# Patient Record
Sex: Female | Born: 1985 | Hispanic: No | Marital: Married | Smoking: Never smoker
Health system: Southern US, Community
[De-identification: ages and names within clinical notes are randomized; demographics above are authoritative.]

## PROBLEM LIST (undated history)

## (undated) DIAGNOSIS — J45909 Unspecified asthma, uncomplicated: Secondary | ICD-10-CM

## (undated) DIAGNOSIS — K219 Gastro-esophageal reflux disease without esophagitis: Secondary | ICD-10-CM

## (undated) DIAGNOSIS — J9383 Other pneumothorax: Secondary | ICD-10-CM

## (undated) HISTORY — DX: Gastro-esophageal reflux disease without esophagitis: K21.9

## (undated) HISTORY — DX: Other pneumothorax: J93.83

---

## 2019-07-28 ENCOUNTER — Ambulatory Visit: Payer: Self-pay | Attending: Internal Medicine

## 2019-07-28 DIAGNOSIS — Z23 Encounter for immunization: Secondary | ICD-10-CM | POA: Insufficient documentation

## 2019-07-28 NOTE — Progress Notes (Signed)
   Covid-19 Vaccination Clinic  Name:  Nourah Caulkins    MRN: [id number] DOB: [birth year]  07/28/2019  Ms. Bubba Camp was observed post Covid-19 immunization for 15 minutes without incidence. She was provided with Vaccine Information Sheet and instruction to access the V-Safe system.   Ms. Bubba Camp was instructed to call 911 with any severe reactions post vaccine: Marland Kitchen Difficulty breathing  . Swelling of your face and throat  . A fast heartbeat  . A bad rash all over your body  . Dizziness and weakness    Immunizations Administered    Name Date Dose VIS Date Route   Pfizer COVID-19 Vaccine 07/28/2019  8:31 AM 0.3 mL 05/18/2019 Intramuscular   Manufacturer: [place]   Lot: [id number]   [place]: [id number]

## 2019-08-22 ENCOUNTER — Ambulatory Visit: Payer: Self-pay | Attending: Internal Medicine

## 2019-08-22 DIAGNOSIS — Z23 Encounter for immunization: Secondary | ICD-10-CM

## 2019-08-22 NOTE — Progress Notes (Signed)
   Covid-19 Vaccination Clinic  Name:  Falana Lane    MRN: [id number] DOB: 30-May-1986  08/22/2019  Rachel Molina was observed post Covid-19 immunization for 15 minutes without incident. She was provided with Vaccine Information Sheet and instruction to access the V-Safe system.   Rachel Molina was instructed to call 911 with any severe reactions post vaccine: Marland Kitchen Difficulty breathing  . Swelling of face and throat  . A fast heartbeat  . A bad rash all over body  . Dizziness and weakness   Immunizations Administered    Name Date Dose VIS Date Route   Pfizer COVID-19 Vaccine 08/22/2019  4:45 PM 0.3 mL 05/18/2019 Intramuscular   Manufacturer: [place]   Lot: [id number]   [place]: [id number]

## 2019-12-15 DIAGNOSIS — Z833 Family history of diabetes mellitus: Secondary | ICD-10-CM | POA: Diagnosis not present

## 2019-12-15 DIAGNOSIS — R0600 Dyspnea, unspecified: Secondary | ICD-10-CM | POA: Diagnosis not present

## 2019-12-15 DIAGNOSIS — J9811 Atelectasis: Secondary | ICD-10-CM | POA: Diagnosis not present

## 2019-12-15 DIAGNOSIS — J939 Pneumothorax, unspecified: Secondary | ICD-10-CM | POA: Diagnosis not present

## 2019-12-15 DIAGNOSIS — J9383 Other pneumothorax: Secondary | ICD-10-CM | POA: Diagnosis not present

## 2019-12-15 DIAGNOSIS — Z8249 Family history of ischemic heart disease and other diseases of the circulatory system: Secondary | ICD-10-CM | POA: Diagnosis not present

## 2019-12-15 DIAGNOSIS — J9311 Primary spontaneous pneumothorax: Secondary | ICD-10-CM | POA: Diagnosis not present

## 2019-12-15 DIAGNOSIS — J45909 Unspecified asthma, uncomplicated: Secondary | ICD-10-CM | POA: Diagnosis not present

## 2019-12-15 DIAGNOSIS — J9382 Other air leak: Secondary | ICD-10-CM | POA: Diagnosis not present

## 2019-12-15 DIAGNOSIS — J930 Spontaneous tension pneumothorax: Secondary | ICD-10-CM | POA: Diagnosis not present

## 2019-12-16 DIAGNOSIS — D72829 Elevated white blood cell count, unspecified: Secondary | ICD-10-CM | POA: Diagnosis not present

## 2019-12-16 DIAGNOSIS — J9383 Other pneumothorax: Secondary | ICD-10-CM | POA: Diagnosis not present

## 2019-12-16 DIAGNOSIS — J45909 Unspecified asthma, uncomplicated: Secondary | ICD-10-CM | POA: Diagnosis not present

## 2019-12-16 DIAGNOSIS — J939 Pneumothorax, unspecified: Secondary | ICD-10-CM | POA: Diagnosis not present

## 2019-12-16 DIAGNOSIS — R0600 Dyspnea, unspecified: Secondary | ICD-10-CM | POA: Diagnosis not present

## 2019-12-16 DIAGNOSIS — J982 Interstitial emphysema: Secondary | ICD-10-CM | POA: Diagnosis not present

## 2019-12-17 DIAGNOSIS — J9383 Other pneumothorax: Secondary | ICD-10-CM | POA: Diagnosis not present

## 2019-12-17 DIAGNOSIS — J45909 Unspecified asthma, uncomplicated: Secondary | ICD-10-CM | POA: Diagnosis not present

## 2019-12-17 DIAGNOSIS — R0600 Dyspnea, unspecified: Secondary | ICD-10-CM | POA: Diagnosis not present

## 2019-12-17 DIAGNOSIS — J939 Pneumothorax, unspecified: Secondary | ICD-10-CM | POA: Diagnosis not present

## 2019-12-17 DIAGNOSIS — D72829 Elevated white blood cell count, unspecified: Secondary | ICD-10-CM | POA: Diagnosis not present

## 2019-12-18 DIAGNOSIS — D72829 Elevated white blood cell count, unspecified: Secondary | ICD-10-CM | POA: Diagnosis not present

## 2019-12-18 DIAGNOSIS — J9383 Other pneumothorax: Secondary | ICD-10-CM | POA: Diagnosis not present

## 2019-12-18 DIAGNOSIS — J90 Pleural effusion, not elsewhere classified: Secondary | ICD-10-CM | POA: Diagnosis not present

## 2019-12-18 DIAGNOSIS — J45909 Unspecified asthma, uncomplicated: Secondary | ICD-10-CM | POA: Diagnosis not present

## 2019-12-18 DIAGNOSIS — J939 Pneumothorax, unspecified: Secondary | ICD-10-CM | POA: Diagnosis not present

## 2019-12-18 DIAGNOSIS — R0600 Dyspnea, unspecified: Secondary | ICD-10-CM | POA: Diagnosis not present

## 2019-12-18 DIAGNOSIS — J9811 Atelectasis: Secondary | ICD-10-CM | POA: Diagnosis not present

## 2019-12-19 DIAGNOSIS — R079 Chest pain, unspecified: Secondary | ICD-10-CM | POA: Diagnosis not present

## 2019-12-19 DIAGNOSIS — Z8709 Personal history of other diseases of the respiratory system: Secondary | ICD-10-CM | POA: Diagnosis not present

## 2019-12-19 DIAGNOSIS — R0789 Other chest pain: Secondary | ICD-10-CM | POA: Diagnosis not present

## 2019-12-20 ENCOUNTER — Emergency Department (HOSPITAL_COMMUNITY): Payer: BC Managed Care – PPO

## 2019-12-20 ENCOUNTER — Telehealth: Payer: Self-pay | Admitting: Pulmonary Disease

## 2019-12-20 ENCOUNTER — Emergency Department (HOSPITAL_COMMUNITY)
Admission: EM | Admit: 2019-12-20 | Discharge: 2019-12-20 | Disposition: A | Discharge status: Home / Self Care | Payer: BC Managed Care – PPO | Attending: Emergency Medicine | Admitting: Emergency Medicine

## 2019-12-20 ENCOUNTER — Other Ambulatory Visit: Payer: Self-pay

## 2019-12-20 ENCOUNTER — Encounter (HOSPITAL_COMMUNITY): Payer: Self-pay

## 2019-12-20 DIAGNOSIS — R0789 Other chest pain: Secondary | ICD-10-CM | POA: Diagnosis not present

## 2019-12-20 DIAGNOSIS — R0602 Shortness of breath: Secondary | ICD-10-CM | POA: Diagnosis not present

## 2019-12-20 DIAGNOSIS — R42 Dizziness and giddiness: Secondary | ICD-10-CM | POA: Diagnosis not present

## 2019-12-20 DIAGNOSIS — R21 Rash and other nonspecific skin eruption: Secondary | ICD-10-CM | POA: Diagnosis not present

## 2019-12-20 DIAGNOSIS — J45909 Unspecified asthma, uncomplicated: Secondary | ICD-10-CM | POA: Insufficient documentation

## 2019-12-20 DIAGNOSIS — Z20822 Contact with and (suspected) exposure to covid-19: Secondary | ICD-10-CM | POA: Diagnosis not present

## 2019-12-20 DIAGNOSIS — J939 Pneumothorax, unspecified: Secondary | ICD-10-CM | POA: Diagnosis not present

## 2019-12-20 DIAGNOSIS — R000 Tachycardia, unspecified: Secondary | ICD-10-CM | POA: Insufficient documentation

## 2019-12-20 DIAGNOSIS — R5383 Other fatigue: Secondary | ICD-10-CM | POA: Diagnosis not present

## 2019-12-20 DIAGNOSIS — J90 Pleural effusion, not elsewhere classified: Secondary | ICD-10-CM | POA: Diagnosis not present

## 2019-12-20 DIAGNOSIS — I10 Essential (primary) hypertension: Secondary | ICD-10-CM | POA: Diagnosis not present

## 2019-12-20 DIAGNOSIS — R52 Pain, unspecified: Secondary | ICD-10-CM | POA: Diagnosis not present

## 2019-12-20 HISTORY — DX: Unspecified asthma, uncomplicated: J45.909

## 2019-12-20 LAB — CBC WITH DIFFERENTIAL/PLATELET
Abs Immature Granulocytes: 0.03 10*3/uL (ref 0.00–0.07)
Basophils Absolute: 0 10*3/uL (ref 0.0–0.1)
Basophils Relative: 1 %
Eosinophils Absolute: 0.2 10*3/uL (ref 0.0–0.5)
Eosinophils Relative: 2 %
HCT: 39.2 % (ref 36.0–46.0)
Hemoglobin: 12.3 g/dL (ref 12.0–15.0)
Immature Granulocytes: 0 %
Lymphocytes Relative: 15 %
Lymphs Abs: 1.3 10*3/uL (ref 0.7–4.0)
MCH: 28.5 pg (ref 26.0–34.0)
MCHC: 31.4 g/dL (ref 30.0–36.0)
MCV: 90.7 fL (ref 80.0–100.0)
Monocytes Absolute: 0.8 10*3/uL (ref 0.1–1.0)
Monocytes Relative: 9 %
Neutro Abs: 6.3 10*3/uL (ref 1.7–7.7)
Neutrophils Relative %: 73 %
Platelets: 299 10*3/uL (ref 150–400)
RBC: 4.32 MIL/uL (ref 3.87–5.11)
RDW: 11.8 % (ref 11.5–15.5)
WBC: 8.6 10*3/uL (ref 4.0–10.5)
nRBC: 0 % (ref 0.0–0.2)

## 2019-12-20 LAB — URINALYSIS, ROUTINE W REFLEX MICROSCOPIC
Bilirubin Urine: NEGATIVE
Glucose, UA: NEGATIVE mg/dL
Hgb urine dipstick: NEGATIVE
Ketones, ur: NEGATIVE mg/dL
Leukocytes,Ua: NEGATIVE
Nitrite: NEGATIVE
Protein, ur: NEGATIVE mg/dL
Specific Gravity, Urine: 1.003 — ABNORMAL LOW (ref 1.005–1.030)
pH: 7 (ref 5.0–8.0)

## 2019-12-20 LAB — BASIC METABOLIC PANEL
Anion gap: 10 (ref 5–15)
BUN: 10 mg/dL (ref 6–20)
CO2: 23 mmol/L (ref 22–32)
Calcium: 9.4 mg/dL (ref 8.9–10.3)
Chloride: 105 mmol/L (ref 98–111)
Creatinine, Ser: 0.65 mg/dL (ref 0.44–1.00)
GFR calc Af Amer: >60 mL/min (ref >60)
GFR calc non Af Amer: >60 mL/min (ref >60)
Glucose, Bld: 122 mg/dL — ABNORMAL HIGH (ref 70–99)
Potassium: 4.6 mmol/L (ref 3.5–5.1)
Sodium: 138 mmol/L (ref 135–145)

## 2019-12-20 LAB — I-STAT BETA HCG BLOOD, ED (MC, WL, AP ONLY): I-stat hCG, quantitative: <5 m[IU]/mL (ref <5)

## 2019-12-20 LAB — LACTIC ACID, PLASMA: Lactic Acid, Venous: 1.9 mmol/L (ref 0.5–1.9)

## 2019-12-20 LAB — SARS CORONAVIRUS 2 BY RT PCR (HOSPITAL ORDER, PERFORMED IN ~~LOC~~ HOSPITAL LAB): SARS Coronavirus 2: NEGATIVE

## 2019-12-20 MED ORDER — SODIUM CHLORIDE 0.9 % IV BOLUS
500.0000 mL | Freq: Once | INTRAVENOUS | Status: AC
  Administered 2019-12-20: 500 mL via INTRAVENOUS

## 2019-12-20 MED ORDER — IOHEXOL 350 MG/ML SOLN
75.0000 mL | Freq: Once | INTRAVENOUS | Status: AC | PRN
  Administered 2019-12-20: 75 mL via INTRAVENOUS

## 2019-12-20 NOTE — ED Provider Notes (Signed)
[name] EMERGENCY DEPARTMENT Provider Note   CSN: [id number] Arrival date & time: 12/20/19  1801     History Chief Complaint  Patient presents with  . Shortness of Breath  . Tachycardia    Rachel Molina is a 34 y.o. female presents to the ED for evaluation of noted tachycardia by PCP earlier today in the office.  Associated with lightheadedness and generalized weakness, shortness of breath with exertion onset today.  PCP called 911 and recommended ED evaluation due to concern for PE.  Patient had recent right spontaneous pneumothorax while traveling to [place].  She was admitted for 3 days in [facility].  Discharged on 7/13.  Has been generally weak since with some right sided chest wall pain where the chest tube was.  They were unable to fly back from [place], so they drove back to the area.  The longest amount of time she was in the car was for 5 to 6 hours before taking breaks.  Has been taken ibuprofen as needed for pain.  Reports ongoing mild right-sided axillary pain on the chest wall where the chest tube has been that is worse with palpation, movement and deep breathing.  No significant changes in pain in this area however.  Denies fever, cough.  Denies recent vomiting or diarrhea.  No UTI symptoms, abdominal or flank pain.  Fully vaccinated for Covid.  Does not take any birth controls.  No history of PE.  Denies significant leg swelling or calf pain, hemoptysis.  HPI     Past Medical History:  Diagnosis Date  . Asthma     There are no problems to display for this patient.   History reviewed. No pertinent surgical history.   OB History   No obstetric history on file.     History reviewed. No pertinent family history.  Social History   Tobacco Use  . Smoking status: Never Smoker  . Smokeless tobacco: Never Used  Substance Use Topics  . Alcohol use: Not Currently  . Drug use: Not Currently    Home Medications Prior to Admission medications    Medication Sig Start Date End Date Taking? Authorizing Provider  ibuprofen (ADVIL) 200 MG tablet Take 600 mg by mouth every 6 (six) hours as needed for moderate pain.   Yes [provider]  PROAIR HFA 108 (90 Base) MCG/ACT inhaler Inhale 2 puffs into the lungs every 6 (six) hours as needed for wheezing or shortness of breath.  12/13/19  Yes [provider]    Allergies    Patient has no known allergies.  Review of Systems   Review of Systems  Constitutional: Positive for fatigue.  Respiratory: Positive for shortness of breath.   Cardiovascular: Positive for chest pain (right chest wall).  Neurological: Positive for weakness (generalized) and light-headedness.  All other systems reviewed and are negative.   Physical Exam Updated Vital Signs BP 120/83   Pulse 81   Temp 100.2 F (37.9 C) (Oral)   Resp (!) 22   Ht 5\' 4"  (1.626 m)   Wt 60.8 kg   LMP 12/05/2019 (Within Days)   SpO2 98%   BMI 23.00 kg/m   Physical Exam Vitals and nursing note reviewed.  Constitutional:      General: She is not in acute distress.    Appearance: She is well-developed.     Comments: NAD.  HENT:     Head: Normocephalic and atraumatic.     Right Ear: External ear normal.  Left Ear: External ear normal.     Nose: Nose normal.  Eyes:     General: No scleral icterus.    Conjunctiva/sclera: Conjunctivae normal.  Cardiovascular:     Rate and Rhythm: Regular rhythm. Tachycardia present.     Heart sounds: Normal heart sounds. No murmur heard.      Comments: HR 100-120s at rest. Regular. No LE edema. No calf tenderness. No murmurs.  Pulmonary:     Effort: Pulmonary effort is normal.     Breath sounds: Normal breath sounds.  Chest:     Chest wall: Tenderness present.     Comments: Approx 2 cm straight wound right midaxillary line consistent with recent chest tube placement. No surrounding erythema, warmth, fluctuance, discharge.  Musculoskeletal:        General: No deformity.  Normal range of motion.     Cervical back: Normal range of motion and neck supple.  Skin:    General: Skin is warm and dry.     Capillary Refill: Capillary refill takes less than 2 seconds.  Neurological:     Mental Status: She is alert and oriented to person, place, and time.  Psychiatric:        Behavior: Behavior normal.        Thought Content: Thought content normal.        Judgment: Judgment normal.     ED Results / Procedures / Treatments   Labs (all labs ordered are listed, but only abnormal results are displayed) Labs Reviewed  BASIC METABOLIC PANEL - Abnormal; Notable for the following components:      Result Value   Glucose, Bld 122 (*)    All other components within normal limits  URINALYSIS, ROUTINE W REFLEX MICROSCOPIC - Abnormal; Notable for the following components:   Color, Urine COLORLESS (*)    Specific Gravity, Urine 1.003 (*)    All other components within normal limits  SARS CORONAVIRUS 2 BY RT PCR (HOSPITAL ORDER, [facility])  CBC WITH DIFFERENTIAL/PLATELET  LACTIC ACID, PLASMA  LACTIC ACID, PLASMA  I-STAT BETA HCG BLOOD, ED ([facility], [facility], [facility] ONLY)    EKG EKG Interpretation  Date/Time:  Thursday December 20 2019 18:52:43 EDT Ventricular Rate:  91 PR Interval:    QRS Duration: 79 QT Interval:  345 QTC Calculation: 425 R Axis:   71 Text Interpretation: Sinus rhythm unremarkable ECG Confirmed by Carmin Muskrat [phone]) on 12/20/2019 6:57:36 PM   Radiology DG Chest 2 View  Result Date: 12/20/2019 CLINICAL DATA:  34 year old female with recent spontaneous pneumothorax on the right side. EXAM: CHEST - 2 VIEW COMPARISON:  None FINDINGS: The lungs are clear. There is no pleural effusion. There is minimal pleural reflection of the lateral right upper lobe. No detectable pneumothorax. The cardiac silhouette is within limits. No acute osseous pathology. IMPRESSION: No active cardiopulmonary disease. Electronically Signed   By: Anner Crete M.D.   On: 12/20/2019 19:38   CT Angio Chest PE W and/or Wo Contrast  Result Date: 12/20/2019 CLINICAL DATA:  Shortness of breath. Concern for PE. Recent right-sided tension pneumothorax. EXAM: CT ANGIOGRAPHY CHEST WITH CONTRAST TECHNIQUE: Multidetector CT imaging of the chest was performed using the standard protocol during bolus administration of intravenous contrast. Multiplanar CT image reconstructions and MIPs were obtained to evaluate the vascular anatomy. CONTRAST:  49mL OMNIPAQUE IOHEXOL 350 MG/ML SOLN COMPARISON:  None. FINDINGS: Cardiovascular: Contrast injection is sufficient to demonstrate satisfactory opacification of the pulmonary arteries to the segmental level. There  is no pulmonary embolus or evidence of right heart strain. The size of the main pulmonary artery is normal. Heart size is normal, with no pericardial effusion. The course and caliber of the aorta are normal. There is no atherosclerotic calcification. Opacification decreased due to pulmonary arterial phase contrast bolus timing. Mediastinum/Nodes: -- No mediastinal lymphadenopathy. -- No hilar lymphadenopathy. -- No axillary lymphadenopathy. -- No supraclavicular lymphadenopathy. -- Normal thyroid gland where visualized. -  Unremarkable esophagus. Lungs/Pleura: There is a trace right-sided pleural effusion with adjacent atelectasis. There is no pneumothorax. There are a few pockets of gas in the patient's right chest wall, presumably related to a recent right-sided chest tube placement. The left lung field is clear. Upper Abdomen: Contrast bolus timing is not optimized for evaluation of the abdominal organs. The visualized portions of the organs of the upper abdomen are normal. Musculoskeletal: No chest wall abnormality. No bony spinal canal stenosis. Review of the MIP images confirms the above findings. IMPRESSION: 1. No acute pulmonary embolism. 2. Trace right-sided pleural effusion with adjacent atelectasis. 3. A few  pockets of gas in the patient's right chest wall, presumably related to a recent right-sided chest tube placement. No pneumothorax. Electronically Signed   By: Constance Holster M.D.   On: 12/20/2019 20:08    Procedures Procedures (including critical care time)  Medications Ordered in ED Medications  sodium chloride 0.9 % bolus 500 mL (0 mLs Intravenous Stopped 12/20/19 2000)  iohexol (OMNIPAQUE) 350 MG/ML injection 75 mL (75 mLs Intravenous Contrast Given 12/20/19 2000)    ED Course  I have reviewed the triage vital signs and the nursing notes.  Pertinent labs & imaging results that were available during my care of the patient were reviewed by me and considered in my medical decision making (see chart for details).  Clinical Course as of Dec 20 2246  Thu Dec 20, 2019  1826 Resp(!): 23 [CG]  1826 Pulse Rate(!): 110 [CG]  1826 Temp: 100.2 F (37.9 C) [CG]  1942 The lungs are clear. There is no pleural effusion. There is minimal pleural reflection of the lateral right upper lobe. No detectable pneumothorax. The cardiac silhouette is within limits. No acute osseous pathology.    DG Chest 2 View [CG]    Clinical Course User Index [CG] Kinnie Feil, PA-C   MDM Rules/Calculators/A&P                          I obtained additional history from triage, nursing notes and review of medical chart.  Previous medical records available, nursing notes reviewed to obtain more history and assist with MDM.  No records available from outside hospital/hospitalization.  Patient complains of generalized weakness, lightheadedness and increased fatigue.  Seems her right-sided chest wall pain from chest tube is unchanged.  No fever.  No cough.  No other infectious symptomatology.  Fully vaccinated for COVID-19.  The differential diagnosis includes infectious process like CAP, COVID-19,?  Covid variant.  High risk for PE given recent hospitalization, long car ride.  Overall nontoxic-appearing,  however is tachycardic, tachypneic.  Oral temp is 100.2.  Hemodynamically stable without hypoxia.  I have added lab work including screening labs, lactic acid, COVID-19 swab.  I have ordered 500 cc IV fluid to address tachycardia.  Ordered chest x-ray, CTA to rule out PE.  ER work-up personally visualized and interpreted, this is vastly reassuring and normal.  No evidence of PE on CTA.  Covid test is negative.  Normal hemoglobin.  UA without signs of infection.  Patient reevaluated after IV fluids and feels a little bit better.  Tachycardia has completely improved.  Discussed ER work-up in detail with patient's husband who is a rheumatologist.  No source identified today of patient's symptoms.  Discussed with EDP, appropriate for discharge with close PCP follow-up.  Patient and husband in agreement with plan.   Final Clinical Impression(s) / ED Diagnoses Final diagnoses:  Sinus tachycardia    Rx / DC Orders ED Discharge Orders    None       Arlean Hopping 12/20/19 2248    Carmin Muskrat, MD 12/22/19 [phone]

## 2019-12-20 NOTE — Telephone Encounter (Signed)
Dr Vaughan Browner received request from another provider for pulmonary consult. Dr Vaughan Browner can see Patient 12/25/19, at 3:15pm. ATC Patient, but had to leave message for her to call back.

## 2019-12-20 NOTE — ED Triage Notes (Signed)
Pt BIB [facility] from PCP for eval of tachycardia. Pt recently had tension PNX 5 days ago, had chest tube that was removed 3 days ago. At PCP today for follow up and was found to have dyspnea on exertion, dizziness and tachycardia after climbing a flight of stairs. Per EMS, pt was hospitalized in [place] for tension PNX.

## 2019-12-20 NOTE — Discharge Instructions (Addendum)
Normal labs, chest x-ray, CTA  Tachycardia improved after IVF  Symptoms of unclear etiology  Rest, stay hydrated  Follow up closely with primary care doctor to ensure symptoms improve   Return for worsening symptoms

## 2019-12-21 NOTE — Telephone Encounter (Signed)
Called and spoke with Patient.  Patient scheduled 12/25/19 at 1515, with Dr Vaughan Browner. Nothing further at this time.

## 2019-12-25 ENCOUNTER — Encounter: Payer: Self-pay | Admitting: Pulmonary Disease

## 2019-12-25 ENCOUNTER — Ambulatory Visit (INDEPENDENT_AMBULATORY_CARE_PROVIDER_SITE_OTHER): Payer: BC Managed Care – PPO | Admitting: Pulmonary Disease

## 2019-12-25 ENCOUNTER — Other Ambulatory Visit: Payer: Self-pay

## 2019-12-25 VITALS — BP 118/86 | HR 105 | Temp 98.5°F | Ht 64.0 in | Wt 135.6 lb

## 2019-12-25 DIAGNOSIS — J849 Interstitial pulmonary disease, unspecified: Secondary | ICD-10-CM

## 2019-12-25 DIAGNOSIS — J9311 Primary spontaneous pneumothorax: Secondary | ICD-10-CM | POA: Diagnosis not present

## 2019-12-25 NOTE — Progress Notes (Signed)
Rachel Molina    [id number]    [birth year]  Primary Care Physician:Patient, No Pcp Per  Referring Physician: No referring provider defined for this encounter.  Chief complaint: Consult for pneumothorax  HPI: 34 year old with history of allergies, mild intermittent asthma Recently hospitalized at [place] for spontaneous pneumothorax.  She was visiting [place] with her husband who is a rheumatologist and was attending a medical conference.  Presentation preceded by 3 days of increasing chest pain, dizziness, dyspnea.   I reviewed the notes from [place].  She was treated with chest tube placement.  Pulmonary consulted who recommended CT but they wanted to get that back and [place].  Travel back to [facility] by car she is reevaluated by her primary care and noted to be tachycardic and sent to ED here.  CTA was negative for pulmonary embolism or any acute lung abnormality.  She did not require repeat hospitalization  Chief complaint today is some soreness, fatigue.  Denies any dyspnea.  Pets: No pets Occupation: Therapist, sports Exposures: No known exposures.  No mold, hot tub, Jacuzzi Smoking history: Never smoker Travel history: Originally from [place].  Immigrated to [place] in 2007.  Previously lived in the [place] area Relevant family history: Father had asthma. She does not have any family history of spontaneous pneumothorax.  Outpatient Encounter Medications as of 12/25/2019  Medication Sig  . PROAIR HFA 108 (90 Base) MCG/ACT inhaler Inhale 2 puffs into the lungs every 6 (six) hours as needed for wheezing or shortness of breath.   Marland Kitchen ibuprofen (ADVIL) 200 MG tablet Take 600 mg by mouth every 6 (six) hours as needed for moderate pain. (Patient not taking: Reported on 12/25/2019)   No facility-administered encounter medications on file as of 12/25/2019.    Allergies as of 12/25/2019  . (No Known Allergies)    Past Medical History:  Diagnosis Date  . Asthma     History  reviewed. No pertinent surgical history.  History reviewed. No pertinent family history.  Social History   Socioeconomic History  . Marital status: Married    Spouse name: Not on file  . Number of children: Not on file  . Years of education: Not on file  . Highest education level: Not on file  Occupational History  . Not on file  Tobacco Use  . Smoking status: Never Smoker  . Smokeless tobacco: Never Used  Substance and Sexual Activity  . Alcohol use: Not Currently  . Drug use: Not Currently  . Sexual activity: Not on file  Other Topics Concern  . Not on file  Social History Narrative  . Not on file   Social Determinants of Health   Financial Resource Strain:   . Difficulty of Paying Living Expenses:   Food Insecurity:   . Worried About Charity fundraiser in the Last Year:   . Arboriculturist in the Last Year:   Transportation Needs:   . Film/video editor (Medical):   Marland Kitchen Lack of Transportation (Non-Medical):   Physical Activity:   . Days of Exercise per Week:   . Minutes of Exercise per Session:   Stress:   . Feeling of Stress :   Social Connections:   . Frequency of Communication with Friends and Family:   . Frequency of Social Gatherings with Friends and Family:   . Attends Religious Services:   . Active Member of Clubs or Organizations:   . Attends Archivist Meetings:   .  Marital Status:   Intimate Partner Violence:   . Fear of Current or Ex-Partner:   . Emotionally Abused:   Marland Kitchen Physically Abused:   . Sexually Abused:     Review of systems: Review of Systems  Constitutional: Negative for fever and chills.  HENT: Negative.   Eyes: Negative for blurred vision.  Respiratory: as per HPI  Cardiovascular: Negative for chest pain and palpitations.  Gastrointestinal: Negative for vomiting, diarrhea, blood per rectum. Genitourinary: Negative for dysuria, urgency, frequency and hematuria.  Musculoskeletal: Negative for myalgias, back pain and  joint pain.  Skin: Negative for itching and rash.  Neurological: Negative for dizziness, tremors, focal weakness, seizures and loss of consciousness.  Endo/Heme/Allergies: Negative for environmental allergies.  Psychiatric/Behavioral: Negative for depression, suicidal ideas and hallucinations.  All other systems reviewed and are negative.  Physical Exam: Blood pressure 118/86, pulse (!) 105, temperature 98.5 F (36.9 C), temperature source Oral, height 5\' 4"  (1.626 m), weight 135 lb 9.6 oz (61.5 kg), last menstrual period 12/05/2019, SpO2 98 %. Gen:      No acute distress HEENT:  EOMI, sclera anicteric Neck:     No masses; no thyromegaly Lungs:    Clear to auscultation bilaterally; normal respiratory effort CV:         Regular rate and rhythm; no murmurs Abd:      + bowel sounds; soft, non-tender; no palpable masses, no distension Ext:    No edema; adequate peripheral perfusion Skin:      Warm and dry; no rash Neuro: alert and oriented x 3 Psych: normal mood and affect  Data Reviewed: Imaging: CTA 12/20/2019-trace right effusion with atelectasis.  Otherwise lungs are clear.  Pocket of gas in the right chest wall likely related to recent chest tube placement I have reviewed the images personally.  PFTs:  Labs: CBC 12/20/2019-WBC 8.6, eos 2%, absolute eosinophil count 172  Assessment:  Spontaneous pneumothorax She has history of mild intermittent asthma not on controller medication No family history or symptoms to suggest any underlying interstitial lung disease  We will get high-resolution CT and PFTs for better evaluation of her lungs Advised her to start mild exercise.  Avoid heavy lifting or plane travel for the next 3 months No other management needed at present.  If pneumothorax is recommended she may need pleurodesis.  Plan/Recommendations: High-res CT, PFTs  Marshell Garfinkel MD  Pulmonary and Critical Care 12/25/2019, 3:37 PM  CC: No ref. provider found

## 2019-12-25 NOTE — Patient Instructions (Addendum)
We will schedule you for high-resolution CT PFTs in 3 months Okay to start mild aerobic exercise.  Avoid heavy lifting and plane travel for 3 months at least  I will see you back in clinic in 3 months to review PFTs.  I will give a call as soon as CT is done to inform you of the results spontaneous pneumothorax

## 2019-12-29 ENCOUNTER — Encounter: Payer: Self-pay | Admitting: Pulmonary Disease

## 2020-01-04 ENCOUNTER — Other Ambulatory Visit: Payer: Self-pay

## 2020-01-04 ENCOUNTER — Ambulatory Visit (INDEPENDENT_AMBULATORY_CARE_PROVIDER_SITE_OTHER)
Admission: RE | Admit: 2020-01-04 | Discharge: 2020-01-04 | Disposition: A | Discharge status: Home / Self Care | Payer: BC Managed Care – PPO | Source: Ambulatory Visit | Attending: Pulmonary Disease | Admitting: Pulmonary Disease

## 2020-01-04 DIAGNOSIS — J849 Interstitial pulmonary disease, unspecified: Secondary | ICD-10-CM | POA: Diagnosis not present

## 2020-01-04 DIAGNOSIS — J90 Pleural effusion, not elsewhere classified: Secondary | ICD-10-CM | POA: Diagnosis not present

## 2020-01-07 ENCOUNTER — Telehealth: Payer: Self-pay | Admitting: Pulmonary Disease

## 2020-01-07 NOTE — Progress Notes (Signed)
Called and left voicemail to call back for results.

## 2020-01-07 NOTE — Telephone Encounter (Signed)
Patient called with results of recent CT scan. Patient verbalized understanding.

## 2020-01-29 ENCOUNTER — Encounter: Payer: Self-pay | Admitting: Pulmonary Disease

## 2020-01-29 NOTE — Progress Notes (Signed)
Pt need to be scheduled for PFTs in Oct 2021. Can you make sure she gets on the schedule? Thanks

## 2020-02-06 ENCOUNTER — Ambulatory Visit: Payer: BC Managed Care – PPO | Admitting: Pulmonary Disease

## 2020-03-17 ENCOUNTER — Ambulatory Visit (INDEPENDENT_AMBULATORY_CARE_PROVIDER_SITE_OTHER): Payer: BC Managed Care – PPO | Admitting: Pulmonary Disease

## 2020-03-17 ENCOUNTER — Other Ambulatory Visit: Payer: Self-pay

## 2020-03-17 DIAGNOSIS — J849 Interstitial pulmonary disease, unspecified: Secondary | ICD-10-CM | POA: Diagnosis not present

## 2020-03-17 LAB — PULMONARY FUNCTION TEST
DL/VA % pred: 122 %
DL/VA: 5.65 ml/min/mmHg/L
DLCO cor % pred: 109 %
DLCO cor: 22.82 ml/min/mmHg
DLCO unc % pred: 109 %
DLCO unc: 22.82 ml/min/mmHg
FEF 25-75 Post: 4.04 L/sec
FEF 25-75 Pre: 3.72 L/sec
FEF2575-%Change-Post: 8 %
FEF2575-%Pred-Post: 125 %
FEF2575-%Pred-Pre: 115 %
FEV1-%Change-Post: 3 %
FEV1-%Pred-Post: 91 %
FEV1-%Pred-Pre: 88 %
FEV1-Post: 2.67 L
FEV1-Pre: 2.59 L
FEV1FVC-%Change-Post: 1 %
FEV1FVC-%Pred-Pre: 104 %
FEV6-%Change-Post: 1 %
FEV6-%Pred-Post: 86 %
FEV6-%Pred-Pre: 85 %
FEV6-Post: 3 L
FEV6-Pre: 2.96 L
FEV6FVC-%Pred-Post: 101 %
FEV6FVC-%Pred-Pre: 101 %
FVC-%Change-Post: 1 %
FVC-%Pred-Post: 85 %
FVC-%Pred-Pre: 84 %
FVC-Post: 3 L
FVC-Pre: 2.96 L
Post FEV1/FVC ratio: 89 %
Post FEV6/FVC ratio: 100 %
Pre FEV1/FVC ratio: 88 %
Pre FEV6/FVC Ratio: 100 %
RV % pred: 49 %
RV: 0.67 L
TLC % pred: 84 %
TLC: 4.01 L

## 2020-03-17 NOTE — Progress Notes (Signed)
Full PFT performed today. °

## 2020-04-03 ENCOUNTER — Other Ambulatory Visit: Payer: Self-pay

## 2020-04-03 ENCOUNTER — Encounter: Payer: Self-pay | Admitting: Family Medicine

## 2020-04-03 ENCOUNTER — Ambulatory Visit (INDEPENDENT_AMBULATORY_CARE_PROVIDER_SITE_OTHER): Payer: BC Managed Care – PPO | Admitting: Family Medicine

## 2020-04-03 VITALS — BP 109/79 | HR 71 | Temp 98.7°F | Ht 64.0 in | Wt 140.2 lb

## 2020-04-03 DIAGNOSIS — Z1159 Encounter for screening for other viral diseases: Secondary | ICD-10-CM

## 2020-04-03 DIAGNOSIS — K219 Gastro-esophageal reflux disease without esophagitis: Secondary | ICD-10-CM | POA: Insufficient documentation

## 2020-04-03 DIAGNOSIS — Z114 Encounter for screening for human immunodeficiency virus [HIV]: Secondary | ICD-10-CM | POA: Diagnosis not present

## 2020-04-03 DIAGNOSIS — R0683 Snoring: Secondary | ICD-10-CM

## 2020-04-03 DIAGNOSIS — Z0000 Encounter for general adult medical examination without abnormal findings: Secondary | ICD-10-CM

## 2020-04-03 DIAGNOSIS — J9383 Other pneumothorax: Secondary | ICD-10-CM | POA: Insufficient documentation

## 2020-04-03 DIAGNOSIS — D2230 Melanocytic nevi of unspecified part of face: Secondary | ICD-10-CM

## 2020-04-03 NOTE — Patient Instructions (Addendum)
- I want you to come back for pap smear or if you want to see gyn, need to get this done to screen for cervical cancer.    -I think it's worth checking a h.pylori on your. You have to be off protonix x 2 weeks. Just let me know so I can order after you have stopped taking this. Work on diet as well.    -sleep study done. It's a referral to pulmonology, so they will call you with this.   -referral to plastics for mole removal. Since on face I want to make sure no bad scars. :)   So nice to meet you!  Dr. Rogers Blocker     Preventive Care 68-51 Years Old, Female Preventive care refers to visits with your health care provider and lifestyle choices that can promote health and wellness. This includes:  A yearly physical exam. This may also be called an annual well check.  Regular dental visits and eye exams.  Immunizations.  Screening for certain conditions.  Healthy lifestyle choices, such as eating a healthy diet, getting regular exercise, not using drugs or products that contain nicotine and tobacco, and limiting alcohol use. What can I expect for my preventive care visit? Physical exam Your health care provider will check your:  Height and weight. This may be used to calculate body mass index (BMI), which tells if you are at a healthy weight.  Heart rate and blood pressure.  Skin for abnormal spots. Counseling Your health care provider may ask you questions about your:  Alcohol, tobacco, and drug use.  Emotional well-being.  Home and relationship well-being.  Sexual activity.  Eating habits.  Work and work Statistician.  Method of birth control.  Menstrual cycle.  Pregnancy history. What immunizations do I need?  Influenza (flu) vaccine  This is recommended every year. Tetanus, diphtheria, and pertussis (Tdap) vaccine  You may need a Td booster every 10 years. Varicella (chickenpox) vaccine  You may need this if you have not been vaccinated. Human  papillomavirus (HPV) vaccine  If recommended by your health care provider, you may need three doses over 6 months. Measles, mumps, and rubella (MMR) vaccine  You may need at least one dose of MMR. You may also need a second dose. Meningococcal conjugate (MenACWY) vaccine  One dose is recommended if you are age 57-21 years and a first-year college student living in a residence hall, or if you have one of several medical conditions. You may also need additional booster doses. Pneumococcal conjugate (PCV13) vaccine  You may need this if you have certain conditions and were not previously vaccinated. Pneumococcal polysaccharide (PPSV23) vaccine  You may need one or two doses if you smoke cigarettes or if you have certain conditions. Hepatitis A vaccine  You may need this if you have certain conditions or if you travel or work in places where you may be exposed to hepatitis A. Hepatitis B vaccine  You may need this if you have certain conditions or if you travel or work in places where you may be exposed to hepatitis B. Haemophilus influenzae type b (Hib) vaccine  You may need this if you have certain conditions. You may receive vaccines as individual doses or as more than one vaccine together in one shot (combination vaccines). Talk with your health care provider about the risks and benefits of combination vaccines. What tests do I need?  Blood tests  Lipid and cholesterol levels. These may be checked every 5 years starting at  age 79.  Hepatitis C test.  Hepatitis B test. Screening  Diabetes screening. This is done by checking your blood sugar (glucose) after you have not eaten for a while (fasting).  Sexually transmitted disease (STD) testing.  BRCA-related cancer screening. This may be done if you have a family history of breast, ovarian, tubal, or peritoneal cancers.  Pelvic exam and Pap test. This may be done every 3 years starting at age 98. Starting at age 74, this may be  done every 5 years if you have a Pap test in combination with an HPV test. Talk with your health care provider about your test results, treatment options, and if necessary, the need for more tests. Follow these instructions at home: Eating and drinking   Eat a diet that includes fresh fruits and vegetables, whole grains, lean protein, and low-fat dairy.  Take vitamin and mineral supplements as recommended by your health care provider.  Do not drink alcohol if: ? Your health care provider tells you not to drink. ? You are pregnant, may be pregnant, or are planning to become pregnant.  If you drink alcohol: ? Limit how much you have to 0-1 drink a day. ? Be aware of how much alcohol is in your drink. In the [place] one drink equals one 12 oz bottle of beer (355 mL), one 5 oz glass of wine (148 mL), or one 1 oz glass of hard liquor (44 mL). Lifestyle  Take daily care of your teeth and gums.  Stay active. Exercise for at least 30 minutes on 5 or more days each week.  Do not use any products that contain nicotine or tobacco, such as cigarettes, e-cigarettes, and chewing tobacco. If you need help quitting, ask your health care provider.  If you are sexually active, practice safe sex. Use a condom or other form of birth control (contraception) in order to prevent pregnancy and STIs (sexually transmitted infections). If you plan to become pregnant, see your health care provider for a preconception visit. What's next?  Visit your health care provider once a year for a well check visit.  Ask your health care provider how often you should have your eyes and teeth checked.  Stay up to date on all vaccines. This information is not intended to replace advice given to you by your health care provider. Make sure you discuss any questions you have with your health care provider. Document Revised: 02/02/2018 Document Reviewed: 02/02/2018 Elsevier Patient Education  2020 [employer].

## 2020-04-03 NOTE — Progress Notes (Signed)
Patient: Rachel Molina MRN: [id number] DOB: 27-Feb-1986 PCP: Orma Flaming, MD     Subjective:  Chief Complaint  Patient presents with  . Establish Care  . Annual Exam  . Gastroesophageal Reflux    HPI: The patient is a 34 y.o. female who presents today for annual exam. She denies any changes to past medical history. There have been no recent hospitalizations. They are following a well balanced diet and exercise plan. She runs on the treadmill daily. Weight has been stable. No complaints today.   No family history of breast or colon cancer. Her father passed away a long time ago from a stroke. Her mother has HTN and diabetes.   History of GERD Has been on protonix x 1 month. She does do spicy foods. States medication has helped some. She is from [place], not been tested for h.pylori.    Immunization History  Administered Date(s) Administered  . Influenza-Unspecified 02/29/2020  . PFIZER SARS-COV-2 Vaccination 07/28/2019, 08/22/2019   Colonoscopy: routine screening  Mammogram: routine screening  Pap smear: long time since she has had this.   Review of Systems  Constitutional: Negative for chills, fatigue and fever.  HENT: Negative for congestion, dental problem, ear pain, hearing loss and trouble swallowing.   Eyes: Negative for visual disturbance.  Respiratory: Negative for cough, chest tightness and shortness of breath.   Cardiovascular: Negative for chest pain, palpitations and leg swelling.  Gastrointestinal: Negative for abdominal pain, blood in stool, diarrhea and nausea.  Endocrine: Negative for cold intolerance, polydipsia, polyphagia and polyuria.  Genitourinary: Negative for dysuria, flank pain, hematuria and urgency.  Musculoskeletal: Negative for arthralgias.  Skin: Negative for rash.  Neurological: Negative for dizziness, light-headedness and headaches.  Psychiatric/Behavioral: Negative for dysphoric mood and sleep disturbance. The patient is not  nervous/anxious.     Allergies Patient has No Known Allergies.  Past Medical History Patient  has a past medical history of Asthma, GERD (gastroesophageal reflux disease), and Spontaneous pneumothorax.  Surgical History Patient  has no past surgical history on file.  Family History Pateint's family history is not on file.  Social History Patient  reports that she has never smoked. She has never used smokeless tobacco. She reports previous alcohol use. She reports previous drug use.    Objective: Vitals:   04/03/20 1058  BP: 109/79  Pulse: 71  Temp: 98.7 F (37.1 C)  TempSrc: Temporal  SpO2: 99%  Weight: 140 lb 3.2 oz (63.6 kg)  Height: 5\' 4"  (1.626 m)    Body mass index is 24.07 kg/m.  Physical Exam Vitals reviewed.  Constitutional:      Appearance: Normal appearance. She is well-developed and normal weight.  HENT:     Head: Normocephalic and atraumatic.     Right Ear: Tympanic membrane, ear canal and external ear normal.     Left Ear: Tympanic membrane, ear canal and external ear normal.     Mouth/Throat:     Mouth: Mucous membranes are moist.  Eyes:     Extraocular Movements: Extraocular movements intact.     Conjunctiva/sclera: Conjunctivae normal.     Pupils: Pupils are equal, round, and reactive to light.  Neck:     Thyroid: No thyromegaly.  Cardiovascular:     Rate and Rhythm: Normal rate and regular rhythm.     Pulses: Normal pulses.     Heart sounds: Normal heart sounds. No murmur heard.   Pulmonary:     Effort: Pulmonary effort is normal.     Breath  sounds: Normal breath sounds.  Abdominal:     General: Abdomen is flat. Bowel sounds are normal. There is no distension.     Palpations: Abdomen is soft.     Tenderness: There is no abdominal tenderness.  Musculoskeletal:     Cervical back: Normal range of motion and neck supple.  Lymphadenopathy:     Cervical: No cervical adenopathy.  Skin:    General: Skin is warm and dry.     Capillary  Refill: Capillary refill takes less than 2 seconds.     Findings: No rash.  Neurological:     General: No focal deficit present.     Mental Status: She is alert and oriented to person, place, and time.     Cranial Nerves: No cranial nerve deficit.     Coordination: Coordination normal.     Deep Tendon Reflexes: Reflexes normal.  Psychiatric:        Mood and Affect: Mood normal.        Behavior: Behavior normal.      Office Visit from 04/03/2020 in [facility]  PHQ-2 Total Score 0         Assessment/plan: 1. Gastroesophageal reflux disease, unspecified whether esophagitis present Would be worth checking h.pylori with hx of living in [place]. She will stop PPI for 2 weeks and let me know so I can put in order. Diet changes discussed. If not getting better she will let me know.   2. Annual physical exam Routine labs today. Hm reviewed. Needs pap smear and will return for this vs. Go to gyn. Otherwise utd. Continue running, healthy diet. Overall doing well. F/u in one year or as needed.  Patient counseling [x]    Nutrition: Stressed importance of moderation in sodium/caffeine intake, saturated fat and cholesterol, caloric balance, sufficient intake of fresh fruits, vegetables, fiber, calcium, iron, and 1 mg of folate supplement per day (for females capable of pregnancy).  [x]    Stressed the importance of regular exercise.   []    Substance Abuse: Discussed cessation/primary prevention of tobacco, alcohol, or other drug use; driving or other dangerous activities under the influence; availability of treatment for abuse.   [x]    Injury prevention: Discussed safety belts, safety helmets, smoke detector, smoking near bedding or upholstery.   [x]    Sexuality: Discussed sexually transmitted diseases, partner selection, use of condoms, avoidance of unintended pregnancy  and contraceptive alternatives.  [x]    Dental health: Discussed importance of regular tooth brushing,  flossing, and dental visits.  [x]    Health maintenance and immunizations reviewed. Please refer to Health maintenance section.    - CBC with Differential/Platelet; Future - Lipid panel; Future - TSH; Future - VITAMIN D 25 Hydroxy (Vit-D Deficiency, Fractures); Future - Hemoglobin A1c; Future - COMPLETE METABOLIC PANEL WITH GFR; Future - COMPLETE METABOLIC PANEL WITH GFR - Hemoglobin A1c - VITAMIN D 25 Hydroxy (Vit-D Deficiency, Fractures) - TSH - Lipid panel - CBC with Differential/Platelet  3. Encounter for screening for HIV   4. Encounter for hepatitis C screening test for low risk patient  - Hepatitis C antibody  5. Snoring  - Ambulatory referral to Pulmonology  6. Change in facial mole Since on face will do plastics. Appears benign.  - Ambulatory referral to Plastic Surgery    This visit occurred during the SARS-CoV-2 public health emergency.  Safety protocols were in place, including screening questions prior to the visit, additional usage of staff PPE, and extensive cleaning of exam room while observing appropriate  contact time as indicated for disinfecting solutions.      Return in about 1 year (around 04/03/2021).     Orma Flaming, MD [place]  04/03/2020

## 2020-04-04 LAB — COMPLETE METABOLIC PANEL WITH GFR
AG Ratio: 1.8 (calc) (ref 1.0–2.5)
ALT: 131 U/L — ABNORMAL HIGH (ref 6–29)
AST: 59 U/L — ABNORMAL HIGH (ref 10–30)
Albumin: 4.9 g/dL (ref 3.6–5.1)
Alkaline phosphatase (APISO): 102 U/L (ref 31–125)
BUN: 11 mg/dL (ref 7–25)
CO2: 26 mmol/L (ref 20–32)
Calcium: 9.5 mg/dL (ref 8.6–10.2)
Chloride: 103 mmol/L (ref 98–110)
Creat: 0.68 mg/dL (ref 0.50–1.10)
GFR, Est African American: 132 mL/min/{1.73_m2} (ref >=60)
GFR, Est Non African American: 114 mL/min/{1.73_m2} (ref >=60)
Globulin: 2.7 g/dL (calc) (ref 1.9–3.7)
Glucose, Bld: 85 mg/dL (ref 65–99)
Potassium: 4.2 mmol/L (ref 3.5–5.3)
Sodium: 138 mmol/L (ref 135–146)
Total Bilirubin: 0.7 mg/dL (ref 0.2–1.2)
Total Protein: 7.6 g/dL (ref 6.1–8.1)

## 2020-04-04 LAB — CBC WITH DIFFERENTIAL/PLATELET
Absolute Monocytes: 515 cells/uL (ref 200–950)
Basophils Absolute: 33 cells/uL (ref 0–200)
Basophils Relative: 0.5 %
Eosinophils Absolute: 172 cells/uL (ref 15–500)
Eosinophils Relative: 2.6 %
HCT: 39.5 % (ref 35.0–45.0)
Hemoglobin: 13.1 g/dL (ref 11.7–15.5)
Lymphs Abs: 1657 cells/uL (ref 850–3900)
MCH: 29.6 pg (ref 27.0–33.0)
MCHC: 33.2 g/dL (ref 32.0–36.0)
MCV: 89.2 fL (ref 80.0–100.0)
MPV: 13.3 fL — ABNORMAL HIGH (ref 7.5–12.5)
Monocytes Relative: 7.8 %
Neutro Abs: 4224 cells/uL (ref 1500–7800)
Neutrophils Relative %: 64 %
Platelets: 259 10*3/uL (ref 140–400)
RBC: 4.43 10*6/uL (ref 3.80–5.10)
RDW: 12.3 % (ref 11.0–15.0)
Total Lymphocyte: 25.1 %
WBC: 6.6 10*3/uL (ref 3.8–10.8)

## 2020-04-04 LAB — LIPID PANEL
Cholesterol: 213 mg/dL — ABNORMAL HIGH (ref <200)
HDL: 56 mg/dL (ref >=50)
LDL Cholesterol (Calc): 135 mg/dL (calc) — ABNORMAL HIGH
Non-HDL Cholesterol (Calc): 157 mg/dL (calc) — ABNORMAL HIGH (ref <130)
Total CHOL/HDL Ratio: 3.8 (calc) (ref <5.0)
Triglycerides: 108 mg/dL (ref <150)

## 2020-04-04 LAB — VITAMIN D 25 HYDROXY (VIT D DEFICIENCY, FRACTURES): Vit D, 25-Hydroxy: 13 ng/mL — ABNORMAL LOW (ref 30–100)

## 2020-04-04 LAB — HEMOGLOBIN A1C
Hgb A1c MFr Bld: 5.5 % of total Hgb (ref <5.7)
Mean Plasma Glucose: 111 (calc)
eAG (mmol/L): 6.2 (calc)

## 2020-04-04 LAB — HEPATITIS C ANTIBODY
Hepatitis C Ab: NONREACTIVE
SIGNAL TO CUT-OFF: 0.01 (ref <1.00)

## 2020-04-04 LAB — TSH: TSH: 3 mIU/L

## 2020-04-08 ENCOUNTER — Encounter: Payer: Self-pay | Admitting: Family Medicine

## 2020-04-08 DIAGNOSIS — R7401 Elevation of levels of liver transaminase levels: Secondary | ICD-10-CM

## 2020-04-09 ENCOUNTER — Other Ambulatory Visit: Payer: Self-pay | Admitting: Pulmonary Disease

## 2020-04-09 ENCOUNTER — Telehealth: Payer: Self-pay

## 2020-04-09 DIAGNOSIS — R748 Abnormal levels of other serum enzymes: Secondary | ICD-10-CM

## 2020-04-09 DIAGNOSIS — G4733 Obstructive sleep apnea (adult) (pediatric): Secondary | ICD-10-CM

## 2020-04-09 NOTE — Telephone Encounter (Signed)
Please schedule lab appointment with the pt.  Thank You

## 2020-04-09 NOTE — Telephone Encounter (Signed)
Patient states she would like to request an ultrasound to be ordered to further look into the reason her liver enzymes were elevated.   Please follow up with patient in regard.

## 2020-04-09 NOTE — Telephone Encounter (Signed)
Please Advise

## 2020-04-09 NOTE — Telephone Encounter (Signed)
Ultrasound is ordered. They will call to set this up,  Dr. Rogers Blocker

## 2020-04-10 ENCOUNTER — Telehealth: Payer: Self-pay | Admitting: Pulmonary Disease

## 2020-04-10 NOTE — Telephone Encounter (Signed)
Duplicate msg.

## 2020-04-10 NOTE — Telephone Encounter (Signed)
ATC Patient.  LM to call back.  Left detailed message (DPR) about HST order being placed, and need for OV at the end of November. [facility] are working on getting HST approved through insurance, so Patient can complete HST next week. AT this time there is a 0830 on 04/30/20, if unavailable will offer 1145 slots.   From: Marshell Garfinkel, MD  Sent: 04/09/2020  3:36 PM EDT  To: Elton Sin, LPN  Subject: Home sleep study                 Can you order a home sleep study to be done on this patient. Would like to get it done in the next week if possible and arrange clinic visit with me by end of nov. Ok to open extra slot before lunch

## 2020-04-10 NOTE — Telephone Encounter (Signed)
Spoke with the pt and notified of Lisa's msg and have her scheduled for 11/24 at 8:30 am.  Nothing further needed.

## 2020-04-11 ENCOUNTER — Telehealth: Payer: Self-pay | Admitting: Pulmonary Disease

## 2020-04-11 NOTE — Telephone Encounter (Signed)
Patient scheduled for Monday.

## 2020-04-11 NOTE — Telephone Encounter (Signed)
lmam for pt to call back to schedule

## 2020-04-14 ENCOUNTER — Ambulatory Visit: Payer: BC Managed Care – PPO

## 2020-04-14 ENCOUNTER — Other Ambulatory Visit: Payer: Self-pay

## 2020-04-14 DIAGNOSIS — G4733 Obstructive sleep apnea (adult) (pediatric): Secondary | ICD-10-CM

## 2020-04-14 DIAGNOSIS — R0683 Snoring: Secondary | ICD-10-CM | POA: Diagnosis not present

## 2020-04-16 ENCOUNTER — Other Ambulatory Visit: Payer: Self-pay

## 2020-04-16 DIAGNOSIS — R5383 Other fatigue: Secondary | ICD-10-CM

## 2020-04-21 ENCOUNTER — Encounter: Payer: Self-pay | Admitting: Family Medicine

## 2020-04-22 ENCOUNTER — Other Ambulatory Visit: Payer: Self-pay

## 2020-04-22 DIAGNOSIS — R0683 Snoring: Secondary | ICD-10-CM | POA: Diagnosis not present

## 2020-04-22 MED ORDER — VITAMIN D (ERGOCALCIFEROL) 1.25 MG (50000 UNIT) PO CAPS
50000.0000 [IU] | ORAL_CAPSULE | ORAL | 0 refills | Status: DC
Start: 2020-04-22 — End: 2022-09-09

## 2020-04-23 DIAGNOSIS — R7989 Other specified abnormal findings of blood chemistry: Secondary | ICD-10-CM | POA: Diagnosis not present

## 2020-04-23 DIAGNOSIS — R748 Abnormal levels of other serum enzymes: Secondary | ICD-10-CM | POA: Diagnosis not present

## 2020-04-24 ENCOUNTER — Encounter: Payer: Self-pay | Admitting: Family Medicine

## 2020-04-24 NOTE — Telephone Encounter (Signed)
Please schedule PAP with pt.  Thank You

## 2020-04-29 ENCOUNTER — Other Ambulatory Visit: Payer: Self-pay

## 2020-04-29 ENCOUNTER — Other Ambulatory Visit: Payer: BC Managed Care – PPO

## 2020-04-29 DIAGNOSIS — R7401 Elevation of levels of liver transaminase levels: Secondary | ICD-10-CM | POA: Diagnosis not present

## 2020-04-29 DIAGNOSIS — K219 Gastro-esophageal reflux disease without esophagitis: Secondary | ICD-10-CM

## 2020-04-29 DIAGNOSIS — R5383 Other fatigue: Secondary | ICD-10-CM

## 2020-04-29 DIAGNOSIS — R748 Abnormal levels of other serum enzymes: Secondary | ICD-10-CM

## 2020-04-30 ENCOUNTER — Other Ambulatory Visit: Payer: Self-pay

## 2020-04-30 ENCOUNTER — Ambulatory Visit (INDEPENDENT_AMBULATORY_CARE_PROVIDER_SITE_OTHER): Payer: BC Managed Care – PPO | Admitting: Pulmonary Disease

## 2020-04-30 ENCOUNTER — Other Ambulatory Visit: Payer: BC Managed Care – PPO

## 2020-04-30 ENCOUNTER — Encounter: Payer: Self-pay | Admitting: Pulmonary Disease

## 2020-04-30 ENCOUNTER — Other Ambulatory Visit: Payer: Self-pay | Admitting: Family Medicine

## 2020-04-30 VITALS — BP 112/70 | HR 68 | Temp 97.8°F | Ht 64.0 in | Wt 139.2 lb

## 2020-04-30 DIAGNOSIS — J9311 Primary spontaneous pneumothorax: Secondary | ICD-10-CM

## 2020-04-30 LAB — IRON,TIBC AND FERRITIN PANEL
%SAT: 28 % (calc) (ref 16–45)
Ferritin: 31 ng/mL (ref 16–154)
Iron: 113 ug/dL (ref 40–190)
TIBC: 402 mcg/dL (calc) (ref 250–450)

## 2020-04-30 LAB — HEPATIC FUNCTION PANEL
AG Ratio: 1.7 (calc) (ref 1.0–2.5)
ALT: 30 U/L — ABNORMAL HIGH (ref 6–29)
AST: 25 U/L (ref 10–30)
Albumin: 4.6 g/dL (ref 3.6–5.1)
Alkaline phosphatase (APISO): 91 U/L (ref 31–125)
Bilirubin, Direct: 0.1 mg/dL (ref 0.0–0.2)
Globulin: 2.7 g/dL (calc) (ref 1.9–3.7)
Indirect Bilirubin: 0.4 mg/dL (calc) (ref 0.2–1.2)
Total Bilirubin: 0.5 mg/dL (ref 0.2–1.2)
Total Protein: 7.3 g/dL (ref 6.1–8.1)

## 2020-04-30 LAB — VITAMIN B12: Vitamin B-12: 944 pg/mL (ref 200–1100)

## 2020-04-30 LAB — H. PYLORI BREATH TEST: H. pylori Breath Test: DETECTED — AB

## 2020-04-30 MED ORDER — CLARITHROMYCIN 500 MG PO TABS: 500.0000 mg | ORAL_TABLET | Freq: Two times a day (BID) | ORAL | 0 refills | Status: DC

## 2020-04-30 MED ORDER — PANTOPRAZOLE SODIUM 40 MG PO TBEC
DELAYED_RELEASE_TABLET | ORAL | 0 refills | Status: DC
Start: 2020-04-30 — End: 2020-10-30

## 2020-04-30 MED ORDER — AMOXICILLIN 500 MG PO CAPS: 1000.0000 mg | ORAL_CAPSULE | Freq: Two times a day (BID) | ORAL | 0 refills | Status: DC

## 2020-04-30 NOTE — Progress Notes (Signed)
Rachel Molina    [id number]    [birth year]  Primary Care Physician:Wolfe, Ebony Hail, MD  Referring Physician: Orma Flaming, MD [place],  [address] [address]  Chief complaint: Follow up for pneumothorax, OSA eval  HPI: 34 year old with history of allergies, mild intermittent asthma Recently hospitalized at [place] for spontaneous pneumothorax.  She was visiting [place] with her husband who is a rheumatologist and was attending a medical conference.  Presentation preceded by 3 days of increasing chest pain, dizziness, dyspnea.   I reviewed the notes from [place].  She was treated with chest tube placement.  Pulmonary consulted who recommended CT but they wanted to get that back and [place].  Travel back to [facility] by car she is reevaluated by her primary care and noted to be tachycardic and sent to ED here.  CTA was negative for pulmonary embolism or any acute lung abnormality.  She did not require repeat hospitalization  Chief complaint today is some soreness, fatigue.  Denies any dyspnea.  Pets: No pets Occupation: Therapist, sports Exposures: No known exposures.  No mold, hot tub, Jacuzzi Smoking history: Never smoker Travel history: Originally from [place].  Immigrated to [place] in 2007.  Previously lived in the [place] area Relevant family history: Father had asthma. She does not have any family history of spontaneous pneumothorax.  Interim history: She had a follow-up high-res CT and PFTs which were normal Recently evaluated for sleep apnea as she has snoring, daytime fatigue and somnolence. She is here for review of results.  Outpatient Encounter Medications as of 04/30/2020  Medication Sig  . ibuprofen (ADVIL) 200 MG tablet Take 600 mg by mouth every 6 (six) hours as needed for moderate pain.   . pantoprazole (PROTONIX) 40 MG tablet Take 40 mg by mouth daily.  Marland Kitchen PROAIR HFA 108 (90 Base) MCG/ACT inhaler Inhale 2 puffs into the lungs every 6 (six) hours as  needed for wheezing or shortness of breath.   . Vitamin D, Ergocalciferol, (DRISDOL) 1.25 MG (50000 UNIT) CAPS capsule Take 1 capsule (50,000 Units total) by mouth every 7 (seven) days. x12 weeks   No facility-administered encounter medications on file as of 04/30/2020.    Physical Exam: Blood pressure 112/70, pulse 68, temperature 97.8 F (36.6 C), temperature source Skin, height 5\' 4"  (1.626 m), weight 139 lb 3.2 oz (63.1 kg), SpO2 100 %. Gen:      No acute distress HEENT:  EOMI, sclera anicteric Neck:     No masses; no thyromegaly Lungs:    Clear to auscultation bilaterally; normal respiratory effort CV:         Regular rate and rhythm; no murmurs Abd:      + bowel sounds; soft, non-tender; no palpable masses, no distension Ext:    No edema; adequate peripheral perfusion Skin:      Warm and dry; no rash Neuro: alert and oriented x 3 Psych: normal mood and affect  Data Reviewed: Imaging: CTA 12/20/2019-trace right effusion with atelectasis.  Otherwise lungs are clear.  Pocket of gas in the right chest wall likely related to recent chest tube placement  High-resolution CT 01/04/2020-no evidence of interstitial lung disease. I have reviewed the images personally  PFTs: 03/17/2020 FVC 3.30 [85%], FEV1 2.67 [91%], F/F 89, TLC 4.01 [4%], DLCO 22.8 [29%] Normal test  Labs: CBC 12/20/2019-WBC 8.6, eos 2%, absolute eosinophil count 172  Assessment:  Spontaneous pneumothorax She has history of mild intermittent asthma not on controller medication  No family history or symptoms to suggest any underlying interstitial lung disease  CT and high-resolution are okay with no underlying interstitial lung disease  Suspected sleep apnea, snoring Reviewed home sleep study with no evidence of OSA.  She did have some desats noted but tells me that the probe displaced and fell off for a period of time at night Review of the O2 sat tracings shows that the noted desaturations may be  artifactual  Discussed with Mrs. Pandey today.  I do not think we will need supplemental oxygen Advised weight loss, raising the head of the bed, sleeping on the side to avoid snoring We will reassess in 6 months and determine if you want to repeat an overnight oximetry.  Plan/Recommendations: Follow-up in 6 months.  Marshell Garfinkel MD [facility] 04/30/2020, 8:58 AM  CC: Orma Flaming, MD

## 2020-04-30 NOTE — Patient Instructions (Signed)
I am glad you are doing well with your breathing I have reviewed a sleep study which does not show sleep apnea. There are some instances of low oxygen level but on review of the tracings these appear to be artifactual.  Please work on weight loss with diet and exercise and raising the head of the bed which will help with the snoring. Sleeping on the side will help as well. We will reevaluate in 6 months and decide if you want to repeat an overnight oximetry.

## 2020-05-08 ENCOUNTER — Other Ambulatory Visit: Payer: BC Managed Care – PPO

## 2020-05-20 ENCOUNTER — Institutional Professional Consult (permissible substitution): Payer: BC Managed Care – PPO | Admitting: Plastic Surgery

## 2020-05-27 ENCOUNTER — Telehealth: Payer: Self-pay

## 2020-05-27 ENCOUNTER — Encounter: Payer: Self-pay | Admitting: Family Medicine

## 2020-05-27 NOTE — Telephone Encounter (Signed)
Pt called stating she received a bill from [facility] 04/03/2020 for her vitamin D test. Pt asked if we could look into how it was coded. Please advise. Transferred pt to billing as well.

## 2020-05-28 NOTE — Telephone Encounter (Signed)
Hey tammy, she is extremely low in vitamin D. May need to change diagnosis to vitamin D deficiency but the whole [place] thing is odd.  Dr. Rogers Blocker

## 2020-06-23 NOTE — Telephone Encounter (Signed)
Rachel Molina with [facility] has emailed their billing department in regard.  Once I hear back I will reach back out to patient in regard.

## 2020-06-23 NOTE — Telephone Encounter (Signed)
I have sent an email to Jocelyn Lamer with [facility] to make sure this was updated.

## 2020-06-30 ENCOUNTER — Ambulatory Visit: Payer: BC Managed Care – PPO | Admitting: Family Medicine

## 2020-07-03 ENCOUNTER — Ambulatory Visit (INDEPENDENT_AMBULATORY_CARE_PROVIDER_SITE_OTHER): Payer: No Typology Code available for payment source | Admitting: Family Medicine

## 2020-07-03 ENCOUNTER — Encounter: Payer: Self-pay | Admitting: Family Medicine

## 2020-07-03 ENCOUNTER — Other Ambulatory Visit: Payer: Self-pay

## 2020-07-03 ENCOUNTER — Other Ambulatory Visit (HOSPITAL_COMMUNITY)
Admission: RE | Admit: 2020-07-03 | Discharge: 2020-07-03 | Disposition: A | Discharge status: Home / Self Care | Payer: No Typology Code available for payment source | Source: Ambulatory Visit | Attending: Family Medicine | Admitting: Family Medicine

## 2020-07-03 VITALS — BP 102/68 | HR 99 | Temp 98.4°F | Ht 64.0 in | Wt 140.2 lb

## 2020-07-03 DIAGNOSIS — N6310 Unspecified lump in the right breast, unspecified quadrant: Secondary | ICD-10-CM | POA: Diagnosis not present

## 2020-07-03 DIAGNOSIS — Z01419 Encounter for gynecological examination (general) (routine) without abnormal findings: Secondary | ICD-10-CM

## 2020-07-03 DIAGNOSIS — N6452 Nipple discharge: Secondary | ICD-10-CM | POA: Diagnosis not present

## 2020-07-03 NOTE — Patient Instructions (Addendum)
-  breasts feel like fibrocystic tissue that will change with cycle. With nipple discharge will get diagnostic mammogram and ultrasound of breasts. Discharge looks physiological as well.   -checking prolactin level as well.   -don't forget let's repeat h.pylori test 3 months after original test.    Good seeing you! Dr. Rogers Blocker

## 2020-07-03 NOTE — Progress Notes (Signed)
SUBJECTIVE:  35 y.o. female for annual routine Pap and checkup. LMP was 06/13/20.   Menarche around age 75-13 years. She has normal periods that last 4-5 days with normal flow. She had a period of anovulatory bleeding when trying to conceive, but since that time she has had normal periods. She will have spotting at times. NO hx of abnormal pap smears. NO history of STDs. She has a little bit of stress incontinence. She is G2P2. She had 2 vaginal deliveries. She is done having kids. She did breast feed a couple of months. No family history of breast or colon cancer. She does self breast checks. She has pain in both of her breasts and can feel a lump in her right breast. It tends to come and go with her cycle. She states if she squeezes her nipples she gets a white/green discharge.    Current Outpatient Medications  Medication Sig Dispense Refill  . amoxicillin (AMOXIL) 500 MG capsule Take 2 capsules (1,000 mg total) by mouth 2 (two) times daily. (Patient not taking: Reported on 07/03/2020) 56 capsule 0  . clarithromycin (BIAXIN) 500 MG tablet Take 1 tablet (500 mg total) by mouth 2 (two) times daily. (Patient not taking: Reported on 07/03/2020) 28 tablet 0  . ibuprofen (ADVIL) 200 MG tablet Take 600 mg by mouth every 6 (six) hours as needed for moderate pain.  (Patient not taking: Reported on 07/03/2020)    . pantoprazole (PROTONIX) 40 MG tablet Take one tablet twice a day for 14 days then daily for a month. (Patient not taking: Reported on 07/03/2020) 58 tablet 0  . PROAIR HFA 108 (90 Base) MCG/ACT inhaler Inhale 2 puffs into the lungs every 6 (six) hours as needed for wheezing or shortness of breath.  (Patient not taking: Reported on 07/03/2020)    . Vitamin D, Ergocalciferol, (DRISDOL) 1.25 MG (50000 UNIT) CAPS capsule Take 1 capsule (50,000 Units total) by mouth every 7 (seven) days. x12 weeks (Patient not taking: Reported on 07/03/2020) 12 capsule 0   No current facility-administered medications for this  visit.   Allergies: Patient has no known allergies.  No LMP recorded.  ROS:  Feeling well. No dyspnea or chest pain on exertion.  No abdominal pain, change in bowel habits, black or bloody stools.  No urinary tract symptoms. GYN ROS: normal menses, no abnormal bleeding, pelvic pain or discharge, she complains of lump in right breast and nipple discharge. No neurological complaints.  OBJECTIVE:  The patient appears well, alert, oriented x 3, in no distress. BP 102/68   Pulse 99   Temp 98.4 F (36.9 C) (Temporal)   Ht 5\' 4"  (1.626 m)   Wt 140 lb 3.2 oz (63.6 kg)   SpO2 97%   BMI 24.07 kg/m  ENT normal.  Neck supple. No adenopathy or thyromegaly. PERLA. Lungs are clear, good air entry, no wheezes, rhonchi or rales. S1 and S2 normal, no murmurs, regular rate and rhythm. Abdomen soft without tenderness, guarding, mass or organomegaly. Extremities show no edema, normal peripheral pulses. Neurological is normal, no focal findings.  BREAST EXAM: breasts appear normal, no suspicious masses, no skin or nipple changes or axillary nodes, symmetric fibrous changes in both upper outer quadrants  PELVIC EXAM: normal external genitalia, vulva, vagina, cervix, uterus and adnexa  ASSESSMENT:  well woman Breast mass and nipple discharge  PLAN:  pap smear  -prolactin today, diagnostic mmg/ultrasound. Her breast tissue feels like fibrocystic changes, not pathological and nipple discharge appears to be physiological  as well, but with complaints of this needs diagnostic imaging.  Ordered for her today.   This visit occurred during the SARS-CoV-2 public health emergency.  Safety protocols were in place, including screening questions prior to the visit, additional usage of staff PPE, and extensive cleaning of exam room while observing appropriate contact time as indicated for disinfecting solutions.     Orma Flaming, MD [place]

## 2020-07-04 LAB — CYTOLOGY - PAP
Comment: NEGATIVE
Diagnosis: NEGATIVE
High risk HPV: NEGATIVE

## 2020-07-04 LAB — PROLACTIN: Prolactin: 9.4 ng/mL

## 2020-07-07 ENCOUNTER — Other Ambulatory Visit: Payer: Self-pay | Admitting: Family Medicine

## 2020-07-07 DIAGNOSIS — N6452 Nipple discharge: Secondary | ICD-10-CM

## 2020-07-07 DIAGNOSIS — N6310 Unspecified lump in the right breast, unspecified quadrant: Secondary | ICD-10-CM

## 2020-07-11 ENCOUNTER — Telehealth: Payer: Self-pay

## 2020-07-11 NOTE — Telephone Encounter (Signed)
Pt called stating she was speaking with her insurance company and was told Dr. Rogers Blocker is not in network with her [facility] Choice Plus plan. I informed patient that Dr. Rogers Blocker does accept [facility] but it depends on her plan whether the provider is in network. Pt would like further explanantion and information on this. Please advise.

## 2020-07-22 ENCOUNTER — Encounter: Payer: Self-pay | Admitting: Plastic Surgery

## 2020-07-22 ENCOUNTER — Ambulatory Visit (INDEPENDENT_AMBULATORY_CARE_PROVIDER_SITE_OTHER): Payer: No Typology Code available for payment source | Admitting: Plastic Surgery

## 2020-07-22 ENCOUNTER — Other Ambulatory Visit: Payer: Self-pay

## 2020-07-22 DIAGNOSIS — L819 Disorder of pigmentation, unspecified: Secondary | ICD-10-CM

## 2020-07-22 NOTE — Progress Notes (Signed)
Patient ID: Rachel Molina, female    DOB: [birth year], 35 y.o.   MRN: [id number]   Chief Complaint  Patient presents with  . Advice Only  . Skin Problem    The patient is a 35 year old female here for evaluation of her skin.  She is 5 feet 4 inches tall weighs 139 pounds.  She has a hyperpigmented lesion on the angle of her mandible on the left.  She feels that it is getting larger and may be even darker.  She does not have a family history of skin cancer.  The change is being concerning to her.  There are no other concerning skin lesions that I see today.  It is about 6 mm in size, raised and hyperpigmented.   Review of Systems  Constitutional: Negative.   HENT: Negative.   Eyes: Negative.   Respiratory: Negative.  Negative for chest tightness and shortness of breath.   Cardiovascular: Negative.   Gastrointestinal: Negative.   Endocrine: Negative.   Genitourinary: Negative.   Musculoskeletal: Negative.   Skin: Negative for pallor and wound.  Hematological: Negative.     Past Medical History:  Diagnosis Date  . Asthma   . GERD (gastroesophageal reflux disease)   . Spontaneous pneumothorax     History reviewed. No pertinent surgical history.    Current Outpatient Medications:  .  amoxicillin (AMOXIL) 500 MG capsule, Take 2 capsules (1,000 mg total) by mouth 2 (two) times daily. (Patient not taking: Reported on 07/03/2020), Disp: 56 capsule, Rfl: 0 .  clarithromycin (BIAXIN) 500 MG tablet, Take 1 tablet (500 mg total) by mouth 2 (two) times daily. (Patient not taking: Reported on 07/03/2020), Disp: 28 tablet, Rfl: 0 .  ibuprofen (ADVIL) 200 MG tablet, Take 600 mg by mouth every 6 (six) hours as needed for moderate pain.  (Patient not taking: Reported on 07/03/2020), Disp: , Rfl:  .  pantoprazole (PROTONIX) 40 MG tablet, Take one tablet twice a day for 14 days then daily for a month. (Patient not taking: Reported on 07/03/2020), Disp: 58 tablet, Rfl: 0 .  PROAIR HFA 108 (90  Base) MCG/ACT inhaler, Inhale 2 puffs into the lungs every 6 (six) hours as needed for wheezing or shortness of breath.  (Patient not taking: Reported on 07/03/2020), Disp: , Rfl:  .  Vitamin D, Ergocalciferol, (DRISDOL) 1.25 MG (50000 UNIT) CAPS capsule, Take 1 capsule (50,000 Units total) by mouth every 7 (seven) days. x12 weeks (Patient not taking: Reported on 07/03/2020), Disp: 12 capsule, Rfl: 0   Objective:   Vitals:   07/22/20 1347  BP: 113/75  Pulse: 73  SpO2: 100%    Physical Exam Vitals and nursing note reviewed.  Constitutional:      Appearance: Normal appearance.  HENT:     Head: Normocephalic and atraumatic.   Cardiovascular:     Rate and Rhythm: Normal rate.     Pulses: Normal pulses.  Pulmonary:     Effort: Pulmonary effort is normal. No respiratory distress.  Skin:    General: Skin is warm.     Capillary Refill: Capillary refill takes less than 2 seconds.  Neurological:     General: No focal deficit present.     Mental Status: She is alert and oriented to person, place, and time.  Psychiatric:        Mood and Affect: Mood normal.        Behavior: Behavior normal.        Thought Content: Thought content  normal.     Assessment & Plan:  Changing pigmented skin lesion  I presented the options to the patient to wait and watch and to excise it and send it to pathology.  The patient would like to move forward with excision.  Plan for excision of left cheek changing skin lesion in the office.  Pictures were obtained of the patient and placed in the chart with the patient's or guardian's permission.   [place], DO

## 2020-07-31 NOTE — Telephone Encounter (Signed)
I have sent an email to credentialing.

## 2020-08-18 ENCOUNTER — Other Ambulatory Visit: Payer: No Typology Code available for payment source

## 2020-09-22 NOTE — Telephone Encounter (Signed)
I have sent another correspondence to [employer].

## 2020-09-22 NOTE — Telephone Encounter (Signed)
I have sent another correspondence to [place].

## 2020-09-26 ENCOUNTER — Ambulatory Visit (INDEPENDENT_AMBULATORY_CARE_PROVIDER_SITE_OTHER): Payer: No Typology Code available for payment source | Admitting: Plastic Surgery

## 2020-09-26 DIAGNOSIS — L819 Disorder of pigmentation, unspecified: Secondary | ICD-10-CM

## 2020-09-26 NOTE — Progress Notes (Signed)
Not seen

## 2020-10-30 ENCOUNTER — Telehealth: Payer: Self-pay

## 2020-10-30 ENCOUNTER — Telehealth: Payer: No Typology Code available for payment source | Admitting: Physician Assistant

## 2020-10-30 DIAGNOSIS — U071 COVID-19: Secondary | ICD-10-CM

## 2020-10-30 MED ORDER — BENZONATATE 100 MG PO CAPS: 100.0000 mg | ORAL_CAPSULE | Freq: Three times a day (TID) | ORAL | 0 refills | Status: DC | PRN

## 2020-10-30 MED ORDER — ALBUTEROL SULFATE HFA 108 (90 BASE) MCG/ACT IN AERS
2.0000 | INHALATION_SPRAY | Freq: Four times a day (QID) | RESPIRATORY_TRACT | 0 refills | Status: AC | PRN
Start: 2020-10-30 — End: ?

## 2020-10-30 NOTE — Progress Notes (Signed)
I have spent 5 minutes in review of e-visit questionnaire, review and updating patient chart, medical decision making and response to patient.   Lynton Crescenzo Cody Daine Croker, PA-C    

## 2020-10-30 NOTE — Progress Notes (Signed)
  E-Visit  for Positive Covid Test Result  We are sorry you are not feeling well. We are here to help!  You have tested positive for COVID-19, meaning that you were infected with the novel coronavirus and could give the virus to others.  It is vitally important that you stay home so you do not spread it to others.      Please continue isolation at home, for at least 10 days since the start of your symptoms and until you have had 24 hours with no fever (without taking a fever reducer) and with improving of symptoms.  If you have no symptoms but tested positive (or all symptoms resolve after 5 days and you have no fever) you can leave your house but continue to wear a mask around others for an additional 5 days. If you have a fever,continue to stay home until you have had 24 hours of no fever. Most cases improve 5-10 days from onset but we have seen a small number of patients who have gotten worse after the 10 days.  Please be sure to watch for worsening symptoms and remain taking the proper precautions.   Go to the nearest hospital ED for assessment if fever/cough/breathlessness are severe or illness seems like a threat to life.    The following symptoms may appear 2-14 days after exposure: Fever Cough Shortness of breath or difficulty breathing Chills Repeated shaking with chills Muscle pain Headache Sore throat New loss of taste or smell Fatigue Congestion or runny nose Nausea or vomiting Diarrhea  You have been enrolled in MyChart Home Monitoring for COVID-19. Daily you will receive a questionnaire within the MyChart website. Our COVID-19 response team will be monitoring your responses daily.  You can use medication such as prescription cough medication called Tessalon Perles 100 mg. You may take 1-2 capsules every 8 hours as needed for cough and  prescription inhaler called Albuterol MDI 90 mcg /actuation 2 puffs every 4 hours as needed for shortness of breath, wheezing,  cough  You may also take acetaminophen (Tylenol) as needed for fever.  HOME CARE: Only take medications as instructed by your medical team. Drink plenty of fluids and get plenty of rest. A steam or ultrasonic humidifier can help if you have congestion.   GET HELP RIGHT AWAY IF YOU HAVE EMERGENCY WARNING SIGNS.  Call 911 or proceed to your closest emergency facility if: You develop worsening high fever. Trouble breathing Bluish lips or face Persistent pain or pressure in the chest New confusion Inability to wake or stay awake You cough up blood. Your symptoms become more severe Inability to hold down food or fluids  This list is not all possible symptoms. Contact your medical provider for any symptoms that are severe or concerning to you.    Your e-visit answers were reviewed by a board certified advanced clinical practitioner to complete your personal care plan.  Depending on the condition, your plan could have included both over the counter or prescription medications.  If there is a problem please reply once you have received a response from your provider.  Your safety is important to us.  If you have drug allergies check your prescription carefully.    You can use MyChart to ask questions about today's visit, request a non-urgent call back, or ask for a work or school excuse for 24 hours related to this e-Visit. If it has been greater than 24 hours you will need to follow up with your provider,   or enter a new e-Visit to address those concerns. You will get an e-mail in the next two days asking about your experience.  I hope that your e-visit has been valuable and will speed your recovery. Thank you for using e-visits.      

## 2020-10-30 NOTE — Telephone Encounter (Signed)
error 

## 2020-11-12 NOTE — Telephone Encounter (Signed)
This was resubmitted by [facility] on April 21st.  Said to allow up to 45 days.  I have left a message to follow up with patient in regard.

## 2021-05-05 ENCOUNTER — Encounter: Payer: Self-pay | Admitting: Physician Assistant

## 2021-05-05 ENCOUNTER — Ambulatory Visit (INDEPENDENT_AMBULATORY_CARE_PROVIDER_SITE_OTHER): Payer: No Typology Code available for payment source | Admitting: Physician Assistant

## 2021-05-05 ENCOUNTER — Other Ambulatory Visit: Payer: Self-pay

## 2021-05-05 VITALS — BP 124/72 | HR 67 | Temp 98.1°F | Ht 63.0 in | Wt 139.1 lb

## 2021-05-05 DIAGNOSIS — R748 Abnormal levels of other serum enzymes: Secondary | ICD-10-CM | POA: Diagnosis not present

## 2021-05-05 DIAGNOSIS — Z131 Encounter for screening for diabetes mellitus: Secondary | ICD-10-CM

## 2021-05-05 DIAGNOSIS — E559 Vitamin D deficiency, unspecified: Secondary | ICD-10-CM | POA: Diagnosis not present

## 2021-05-05 DIAGNOSIS — Z0000 Encounter for general adult medical examination without abnormal findings: Secondary | ICD-10-CM | POA: Diagnosis not present

## 2021-05-05 DIAGNOSIS — Z13220 Encounter for screening for lipoid disorders: Secondary | ICD-10-CM

## 2021-05-05 DIAGNOSIS — N6452 Nipple discharge: Secondary | ICD-10-CM

## 2021-05-05 LAB — CBC WITH DIFFERENTIAL/PLATELET
Basophils Absolute: 0 K/uL (ref 0.0–0.1)
Basophils Relative: 0.5 % (ref 0.0–3.0)
Eosinophils Absolute: 0.2 K/uL (ref 0.0–0.7)
Eosinophils Relative: 3.1 % (ref 0.0–5.0)
HCT: 39 % (ref 36.0–46.0)
Hemoglobin: 12.7 g/dL (ref 12.0–15.0)
Lymphocytes Relative: 28.5 % (ref 12.0–46.0)
Lymphs Abs: 2 K/uL (ref 0.7–4.0)
MCHC: 32.7 g/dL (ref 30.0–36.0)
MCV: 89 fl (ref 78.0–100.0)
Monocytes Absolute: 0.6 K/uL (ref 0.1–1.0)
Monocytes Relative: 8.7 % (ref 3.0–12.0)
Neutro Abs: 4.2 K/uL (ref 1.4–7.7)
Neutrophils Relative %: 59.2 % (ref 43.0–77.0)
Platelets: 267 K/uL (ref 150.0–400.0)
RBC: 4.38 Mil/uL (ref 3.87–5.11)
RDW: 12.7 % (ref 11.5–15.5)
WBC: 7.1 K/uL (ref 4.0–10.5)

## 2021-05-05 LAB — LIPID PANEL
Cholesterol: 189 mg/dL (ref 0–200)
HDL: 51.5 mg/dL (ref >39.00)
LDL Cholesterol: 114 mg/dL — ABNORMAL HIGH (ref 0–99)
NonHDL: 137.96
Total CHOL/HDL Ratio: 4
Triglycerides: 119 mg/dL (ref 0.0–149.0)
VLDL: 23.8 mg/dL (ref 0.0–40.0)

## 2021-05-05 LAB — COMPREHENSIVE METABOLIC PANEL WITH GFR
ALT: 18 U/L (ref 0–35)
AST: 19 U/L (ref 0–37)
Albumin: 4.8 g/dL (ref 3.5–5.2)
Alkaline Phosphatase: 70 U/L (ref 39–117)
BUN: 12 mg/dL (ref 6–23)
CO2: 26 meq/L (ref 19–32)
Calcium: 9.5 mg/dL (ref 8.4–10.5)
Chloride: 104 meq/L (ref 96–112)
Creatinine, Ser: 0.76 mg/dL (ref 0.40–1.20)
GFR: 101.26 mL/min
Glucose, Bld: 84 mg/dL (ref 70–99)
Potassium: 4.5 meq/L (ref 3.5–5.1)
Sodium: 138 meq/L (ref 135–145)
Total Bilirubin: 0.7 mg/dL (ref 0.2–1.2)
Total Protein: 7.6 g/dL (ref 6.0–8.3)

## 2021-05-05 LAB — VITAMIN D 25 HYDROXY (VIT D DEFICIENCY, FRACTURES): VITD: 42.9 ng/mL (ref 30.00–100.00)

## 2021-05-05 LAB — HEMOGLOBIN A1C: Hgb A1c MFr Bld: 5.8 % (ref 4.6–6.5)

## 2021-05-05 NOTE — Progress Notes (Signed)
Subjective:    Patient ID: Rachel Molina, female    DOB: 05-26-1986, 35 y.o.   MRN: [id number]  Chief Complaint  Patient presents with   Annual Exam    Transfer of Care [place]     HPI 35 y.o. patient presents today for new patient establishment with me.  Patient was previously established with Dr. Rogers Blocker.  Current Care Team: None    Acute Concerns: Pt had a well woman exam with Dr. Rogers Blocker on 07/03/20 and mentioned she has some light green discharge from nipples with palpation. Seems to have more lumps in her breast arise during her cycles. It was recommended that she has diagnostic mammogram and [place] of breasts done, but patients states that she got busy this year and could not have this scheduled.  She would also like to have some f/up labs done as she has hx of gestational diabetes, low Vit D, elevated LFTs, and elevated cholesterol.   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 No pertinent surgical history.  History reviewed. No pertinent family history.  Social History   Tobacco Use   Smoking status: Never   Smokeless tobacco: Never  Substance Use Topics   Alcohol use: Not Currently   Drug use: Not Currently     No Known Allergies  Review of Systems NEGATIVE UNLESS OTHERWISE INDICATED IN HPI      Objective:     BP 124/72   Pulse 67   Temp 98.1 F (36.7 C) (Temporal)   Ht 5\' 3"  (1.6 m)   Wt 139 lb 2 oz (63.1 kg)   LMP 04/30/2021   SpO2 99%   BMI 24.64 kg/m   Wt Readings from Last 3 Encounters:  05/05/21 139 lb 2 oz (63.1 kg)  07/22/20 139 lb 6.4 oz (63.2 kg)  07/03/20 140 lb 3.2 oz (63.6  kg)    BP Readings from Last 3 Encounters:  05/05/21 124/72  07/22/20 113/75  07/03/20 102/68     Physical Exam Vitals reviewed.  Constitutional:      Appearance: Normal appearance. She is well-developed and normal weight.  HENT:     Head: Normocephalic and atraumatic.     Right Ear: Tympanic membrane, ear canal and external ear normal.     Left Ear: Tympanic membrane, ear canal and external ear normal.     Mouth/Throat:     Mouth: Mucous membranes are moist.  Eyes:     Extraocular Movements: Extraocular movements intact.     Conjunctiva/sclera: Conjunctivae normal.     Pupils: Pupils are equal, round, and reactive to light.  Neck:     Thyroid: No thyromegaly.  Cardiovascular:     Rate and Rhythm: Normal rate and regular rhythm.     Pulses: Normal pulses.     Heart sounds: Normal heart sounds. No murmur heard. Pulmonary:     Effort: Pulmonary effort is normal.     Breath sounds: Normal breath sounds.  Chest:  Breasts:    Right: No nipple discharge, skin change or tenderness.     Left: No nipple discharge, skin change or tenderness.     Comments: Bilateral dense /  fibrocystic breasts, no discrete masses Abdominal:     General: Abdomen is flat. Bowel sounds are normal. There is no distension.     Palpations: Abdomen is soft.     Tenderness: There is no abdominal tenderness.  Musculoskeletal:     Cervical back: Normal range of motion and neck supple.  Lymphadenopathy:     Cervical: No cervical adenopathy.     Upper Body:     Right upper body: No supraclavicular, axillary or pectoral adenopathy.     Left upper body: No supraclavicular, axillary or pectoral adenopathy.  Skin:    General: Skin is warm and dry.     Capillary Refill: Capillary refill takes less than 2 seconds.     Findings: No rash.  Neurological:     General: No focal deficit present.     Mental Status: She is alert and oriented to person, place, and time.     Cranial Nerves: No cranial nerve deficit.      Coordination: Coordination normal.     Deep Tendon Reflexes: Reflexes normal.  Psychiatric:        Mood and Affect: Mood normal.        Behavior: Behavior normal.       Assessment & Plan:   Problem List Items Addressed This Visit   None Visit Diagnoses     Encounter for annual physical exam    -  Primary   Relevant Orders   CBC with Differential/Platelet (Completed)   Comprehensive metabolic panel (Completed)   Lipid panel (Completed)   Hemoglobin A1c (Completed)   VITAMIN D 25 Hydroxy (Vit-D Deficiency, Fractures) (Completed)   Discharge from both nipples       Relevant Orders   CBC with Differential/Platelet (Completed)   MM Digital Diagnostic Bilat   US BREAST COMPLETE UNI RIGHT INC AXILLA   US BREAST COMPLETE UNI LEFT INC AXILLA   Diabetes mellitus screening       Relevant Orders   Comprehensive metabolic panel (Completed)   Hemoglobin A1c (Completed)   Screening for cholesterol level       Relevant Orders   Lipid panel (Completed)   Vitamin D deficiency       Relevant Orders   VITAMIN D 25 Hydroxy (Vit-D Deficiency, Fractures) (Completed)   Elevated liver enzymes       Relevant Orders   Comprehensive metabolic panel (Completed)      Plan: -Age-appropriate screening and counseling performed today. Will check labs and call with results. Preventive measures discussed and printed in AVS for patient. UTD on HM. Will schedule for US breasts and diagnostic mammogram. Encouraged her to keep up good work with her health.    Rachel Oceguera M Lonzo Saulter, PA-C

## 2021-05-05 NOTE — Patient Instructions (Signed)
Good to meet you today! Please go to the lab for blood work and I will send results through [facility]. I have placed orders for [place] and mammogram of the breasts.

## 2021-05-23 DIAGNOSIS — Z6823 Body mass index (BMI) 23.0-23.9, adult: Secondary | ICD-10-CM | POA: Diagnosis not present

## 2021-05-23 DIAGNOSIS — M25571 Pain in right ankle and joints of right foot: Secondary | ICD-10-CM | POA: Diagnosis not present

## 2021-05-23 DIAGNOSIS — N914 Secondary oligomenorrhea: Secondary | ICD-10-CM | POA: Diagnosis not present

## 2021-06-07 HISTORY — PX: LASIK: SHX215

## 2021-06-18 ENCOUNTER — Ambulatory Visit
Admission: RE | Admit: 2021-06-18 | Discharge: 2021-06-18 | Disposition: A | Discharge status: Home / Self Care | Payer: BC Managed Care – PPO | Source: Ambulatory Visit | Attending: Physician Assistant | Admitting: Physician Assistant

## 2021-06-18 ENCOUNTER — Other Ambulatory Visit: Payer: Self-pay | Admitting: Physician Assistant

## 2021-06-18 ENCOUNTER — Ambulatory Visit
Admission: RE | Admit: 2021-06-18 | Discharge: 2021-06-18 | Disposition: A | Discharge status: Home / Self Care | Payer: Self-pay | Source: Ambulatory Visit | Attending: Physician Assistant | Admitting: Physician Assistant

## 2021-06-18 ENCOUNTER — Ambulatory Visit: Payer: Self-pay

## 2021-06-18 DIAGNOSIS — N6452 Nipple discharge: Secondary | ICD-10-CM | POA: Diagnosis not present

## 2021-06-18 DIAGNOSIS — R922 Inconclusive mammogram: Secondary | ICD-10-CM | POA: Diagnosis not present

## 2021-06-18 DIAGNOSIS — R928 Other abnormal and inconclusive findings on diagnostic imaging of breast: Secondary | ICD-10-CM

## 2021-09-12 IMAGING — CT CT ANGIO CHEST
2 of 6 series · 18 of 36 positions shown · IV contrast (omnipaque)
Comparison: None.

CLINICAL DATA: Shortness of breath. Concern for PE. Recent
right-sided tension pneumothorax.

EXAM:
CT ANGIOGRAPHY CHEST WITH CONTRAST
TECHNIQUE: Multidetector CT imaging of the chest was performed using the
standard protocol during bolus administration of intravenous
contrast. Multiplanar CT image reconstructions and MIPs were
obtained to evaluate the vascular anatomy.
CONTRAST:  75mL OMNIPAQUE IOHEXOL 350 MG/ML SOLN

[Series 7: pe thins · axial · 0.72mm/px · z∈[-179,+41]mm · 17 of 350 slices shown]
[im 18/350  lung]
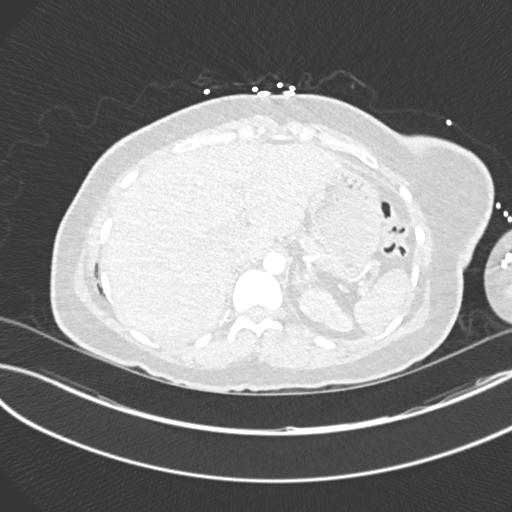
[im 35/350  mediastinal]
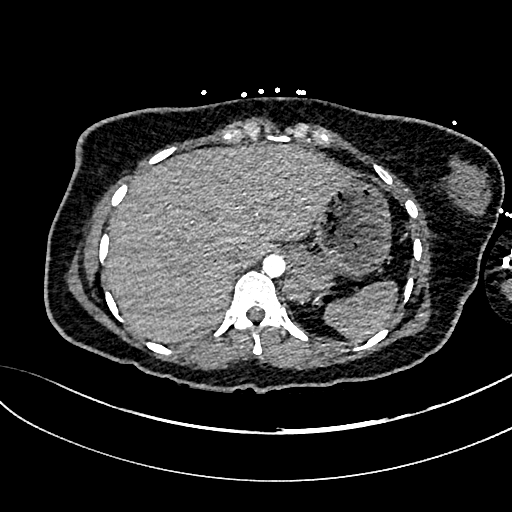
[im 53/350  lung]
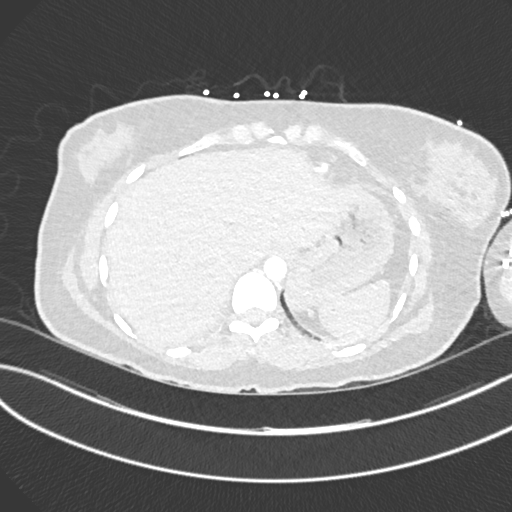
[im 70/350  mediastinal]
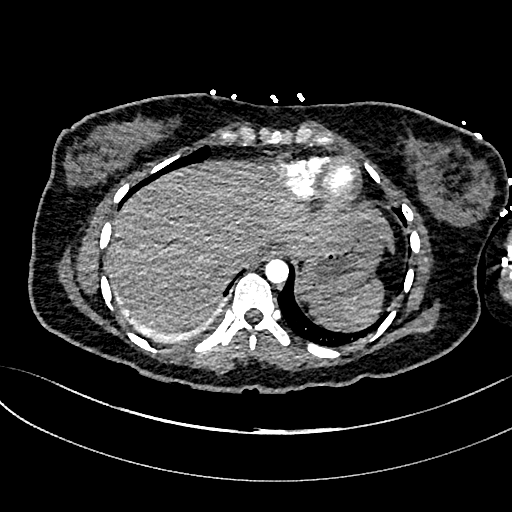
[im 105/350  lung]
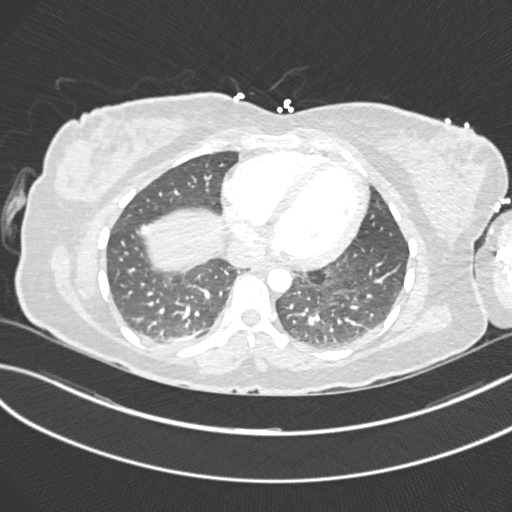
[im 123/350  mediastinal]
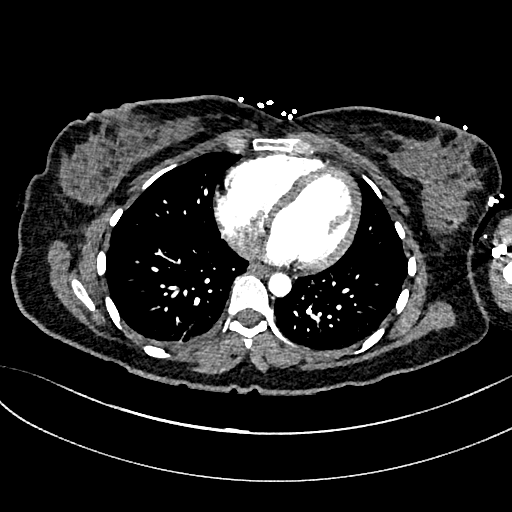
[im 140/350  lung]
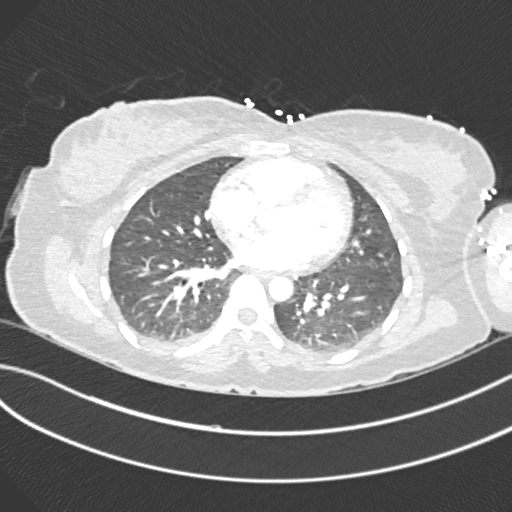
[im 158/350  mediastinal]
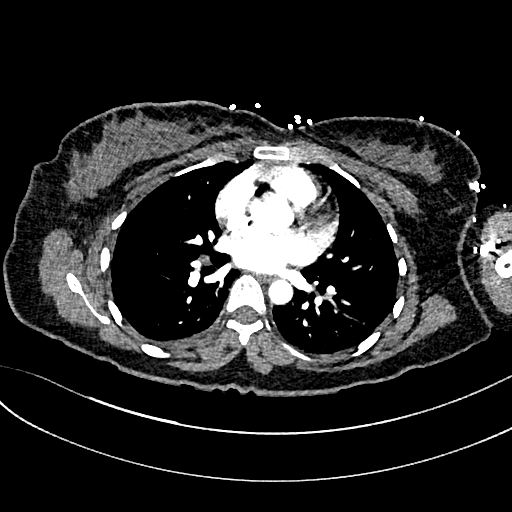
[im 175/350  lung]
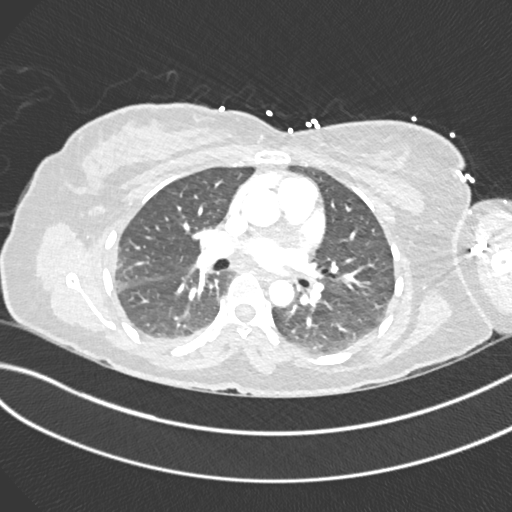
[im 192/350  mediastinal]
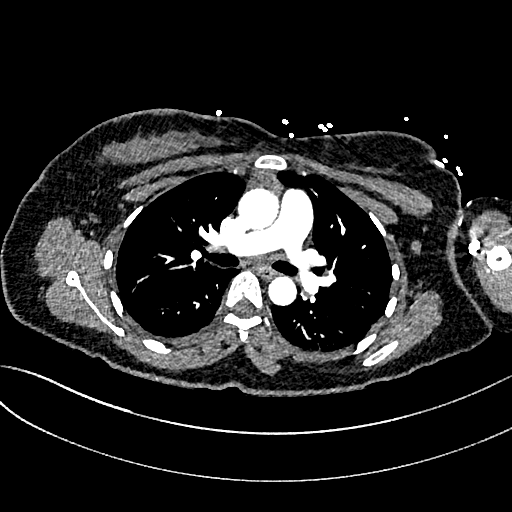
[im 210/350  lung]
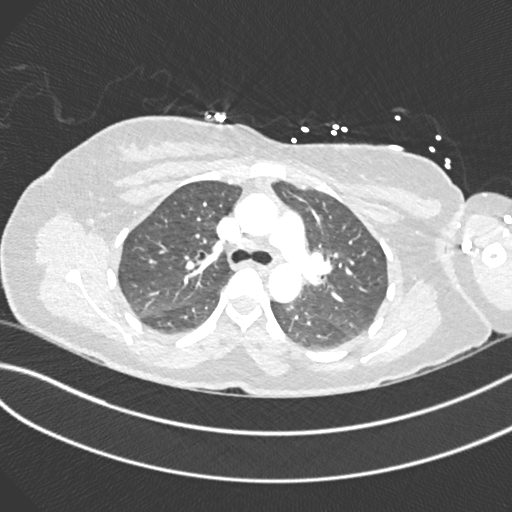
[im 227/350  mediastinal]
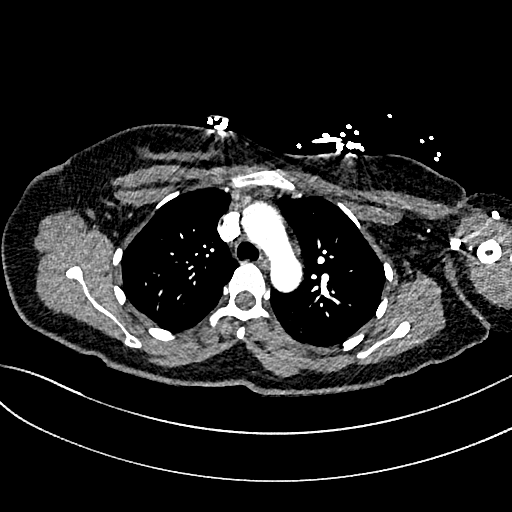
[im 245/350  lung]
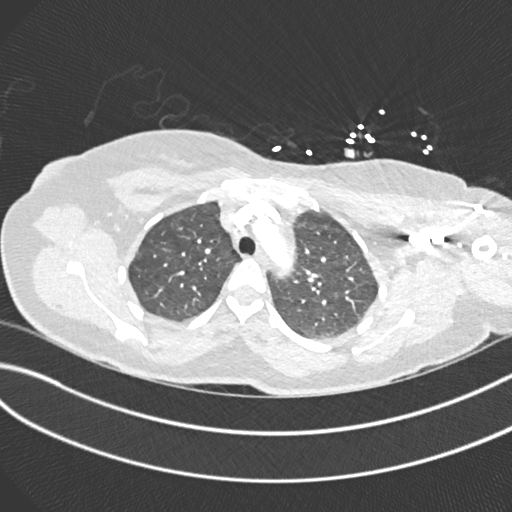
[im 280/350  mediastinal]
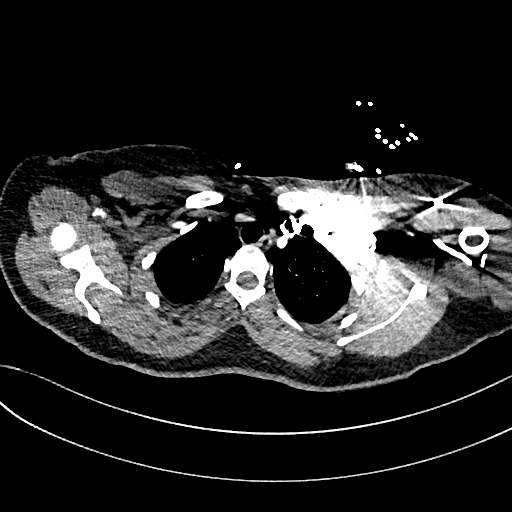
[im 297/350  lung]
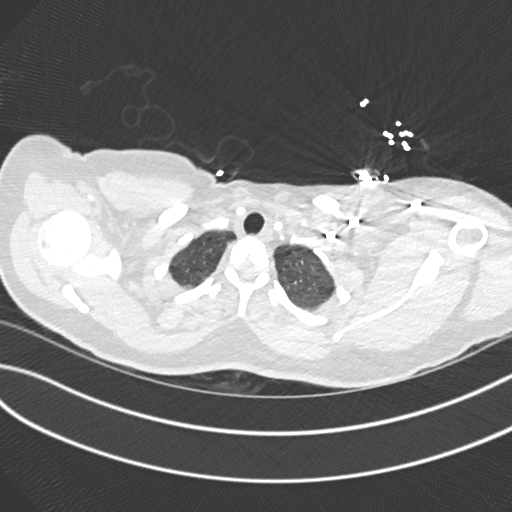
[im 315/350  mediastinal]
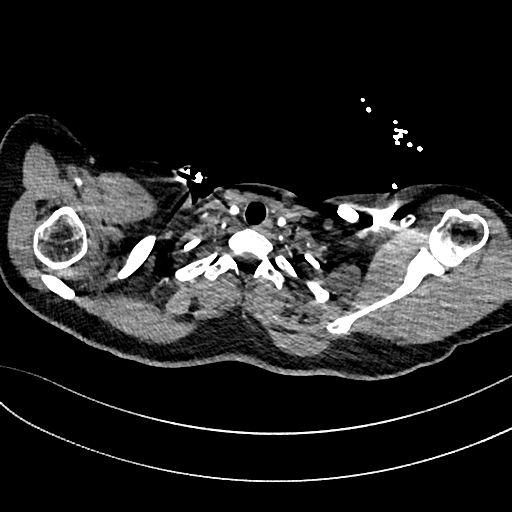
[im 332/350  lung]
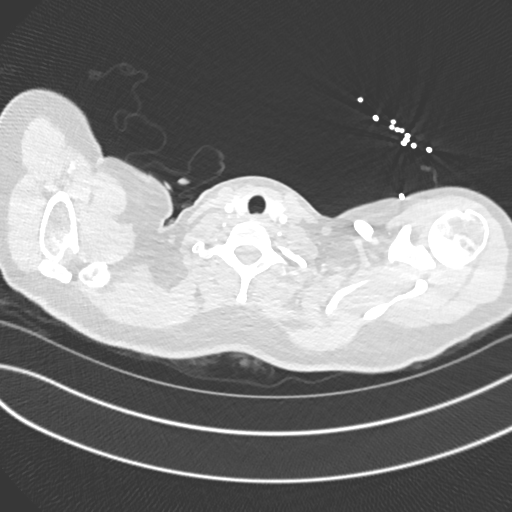

[Series 8: pe 2mm cor · coronal · 0.59mm/px · 1 of 155 slices shown]
[im 78/155  mediastinal]
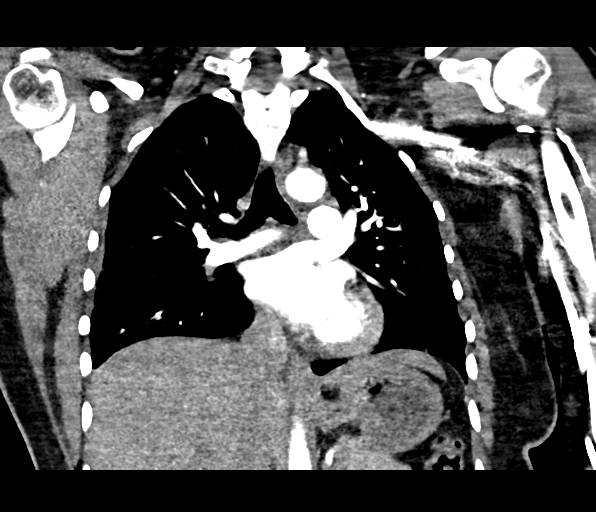

[18 of 36 positions shown; findings below may reference images not displayed]

FINDINGS: Cardiovascular: Contrast injection is sufficient to demonstrate
satisfactory opacification of the pulmonary arteries to the
segmental level. There is no pulmonary embolus or evidence of right
heart strain. The size of the main pulmonary artery is normal. Heart
size is normal, with no pericardial effusion. The course and caliber
of the aorta are normal. There is no atherosclerotic calcification.
Opacification decreased due to pulmonary arterial phase contrast
bolus timing.

Mediastinum/Nodes:

-- No mediastinal lymphadenopathy.

-- No hilar lymphadenopathy.

-- No axillary lymphadenopathy.

-- No supraclavicular lymphadenopathy.

-- Normal thyroid gland where visualized.

-  Unremarkable esophagus.

Lungs/Pleura: There is a trace right-sided pleural effusion with
adjacent atelectasis. There is no pneumothorax. There are a few
pockets of gas in the patient's right chest wall, presumably related
to a recent right-sided chest tube placement. The left lung field is
clear.

Upper Abdomen: Contrast bolus timing is not optimized for evaluation
of the abdominal organs. The visualized portions of the organs of
the upper abdomen are normal.

Musculoskeletal: No chest wall abnormality. No bony spinal canal
stenosis.

Review of the MIP images confirms the above findings.
IMPRESSION: 1. No acute pulmonary embolism.
2. Trace right-sided pleural effusion with adjacent atelectasis.
3. A few pockets of gas in the patient's right chest wall,
presumably related to a recent right-sided chest tube placement. No
pneumothorax.

## 2022-03-01 ENCOUNTER — Encounter: Payer: Self-pay | Admitting: *Deleted

## 2022-05-20 ENCOUNTER — Encounter: Payer: Self-pay | Admitting: *Deleted

## 2022-07-27 ENCOUNTER — Encounter: Payer: Self-pay | Admitting: Physician Assistant

## 2022-07-27 ENCOUNTER — Ambulatory Visit (INDEPENDENT_AMBULATORY_CARE_PROVIDER_SITE_OTHER): Payer: BC Managed Care – PPO | Admitting: Physician Assistant

## 2022-07-27 VITALS — BP 106/74 | HR 67 | Temp 98.0°F | Ht 63.94 in | Wt 137.6 lb

## 2022-07-27 DIAGNOSIS — Z0000 Encounter for general adult medical examination without abnormal findings: Secondary | ICD-10-CM | POA: Diagnosis not present

## 2022-07-27 DIAGNOSIS — Z8632 Personal history of gestational diabetes: Secondary | ICD-10-CM | POA: Insufficient documentation

## 2022-07-27 DIAGNOSIS — L819 Disorder of pigmentation, unspecified: Secondary | ICD-10-CM | POA: Diagnosis not present

## 2022-07-27 NOTE — Patient Instructions (Signed)
Good to see you today!  Please schedule for labs. Next physical in one year.   Please call the following to reschedule for excision of your skin lesion: Theodoro Kos Dillingham, DO Plastic surgeon [place], [place]  [phone]

## 2022-07-27 NOTE — Assessment & Plan Note (Signed)
Patient previously saw plastic surgery, Dr. Marla Roe.  She had planned for excision, but never scheduled a follow-up.  Provided information to have this scheduled.

## 2022-07-27 NOTE — Assessment & Plan Note (Signed)
Age-appropriate screening and counseling performed today. Will check labs and call with results. Preventive measures discussed and printed in AVS for patient.   Patient Counseling: [x]$   Nutrition: Stressed importance of moderation in sodium/caffeine intake, saturated fat and cholesterol, caloric balance, sufficient intake of fresh fruits, vegetables, and fiber.  [x]$   Stressed the importance of regular exercise.   []$   Substance Abuse: Discussed cessation/primary prevention of tobacco, alcohol, or other drug use; driving or other dangerous activities under the influence; availability of treatment for abuse.   []$   Injury prevention: Discussed safety belts, safety helmets, smoke detector, smoking near bedding or upholstery.   []$   Sexuality: Discussed sexually transmitted diseases, partner selection, use of condoms, avoidance of unintended pregnancy  and contraceptive alternatives.   [x]$   Dental health: Discussed importance of regular tooth brushing, flossing, and dental visits.  [x]$   Health maintenance and immunizations reviewed. Please refer to Health maintenance section.

## 2022-07-27 NOTE — Progress Notes (Signed)
Subjective:    Patient ID: Rachel Molina, female    DOB: 1985/07/12, 37 y.o.   MRN: [id number]  Chief Complaint  Patient presents with   Annual Exam    Pt in the office for annual CPE; no concerns to discuss;     HPI Patient is in today for annual exam. No major medical changes since last visit.   Acute concerns: None  Health maintenance: Lifestyle/ exercise: Enjoys running on the treadmill when she can, walks at work  Nutrition: Well-balanced Mental health: Feeling fine  Substance use: None  ETOH: None Sexual activity: Monogamous Immunizations: UTD  Pap: UTD  Menstrual cycles: Regular, no issues    Past Medical History:  Diagnosis Date   Asthma    GERD (gastroesophageal reflux disease)    Spontaneous pneumothorax     History reviewed. No pertinent surgical history.  History reviewed. No pertinent family history.  Social History   Tobacco Use   Smoking status: Never   Smokeless tobacco: Never  Substance Use Topics   Alcohol use: Not Currently   Drug use: Not Currently     No Known Allergies  Review of Systems NEGATIVE UNLESS OTHERWISE INDICATED IN HPI      Objective:     BP 106/74 (BP Location: Right Arm)   Pulse 67   Temp 98 F (36.7 C) (Temporal)   Ht 5' 3.94" (1.624 m)   Wt 137 lb 9.6 oz (62.4 kg)   SpO2 99%   BMI 23.67 kg/m   Wt Readings from Last 3 Encounters:  07/27/22 137 lb 9.6 oz (62.4 kg)  05/05/21 139 lb 2 oz (63.1 kg)  07/22/20 139 lb 6.4 oz (63.2 kg)    BP Readings from Last 3 Encounters:  07/27/22 106/74  05/05/21 124/72  07/22/20 113/75     Physical Exam Vitals and nursing note reviewed.  Constitutional:      Appearance: Normal appearance. She is normal weight. She is not toxic-appearing.  HENT:     Head: Normocephalic and atraumatic.     Right Ear: Tympanic membrane, ear canal and external ear normal.     Left Ear: Tympanic membrane, ear canal and external ear normal.     Nose: Nose normal.      Mouth/Throat:     Mouth: Mucous membranes are moist.  Eyes:     Extraocular Movements: Extraocular movements intact.     Conjunctiva/sclera: Conjunctivae normal.     Pupils: Pupils are equal, round, and reactive to light.  Cardiovascular:     Rate and Rhythm: Normal rate and regular rhythm.     Pulses: Normal pulses.     Heart sounds: Normal heart sounds.  Pulmonary:     Effort: Pulmonary effort is normal.     Breath sounds: Normal breath sounds.  Abdominal:     General: Abdomen is flat. Bowel sounds are normal.     Palpations: Abdomen is soft.  Musculoskeletal:        General: Normal range of motion.     Cervical back: Normal range of motion and neck supple.  Skin:    General: Skin is warm and dry.     Findings: Lesion (left jawline raised approx 2 cm multicolored nevus) present.  Neurological:     General: No focal deficit present.     Mental Status: She is alert and oriented to person, place, and time.  Psychiatric:        Mood and Affect: Mood normal.  Behavior: Behavior normal.        Thought Content: Thought content normal.        Judgment: Judgment normal.        Assessment & Plan:  Encounter for annual physical exam Assessment & Plan: Age-appropriate screening and counseling performed today. Will check labs and call with results. Preventive measures discussed and printed in AVS for patient.   Patient Counseling: [x]$   Nutrition: Stressed importance of moderation in sodium/caffeine intake, saturated fat and cholesterol, caloric balance, sufficient intake of fresh fruits, vegetables, and fiber.  [x]$   Stressed the importance of regular exercise.   []$   Substance Abuse: Discussed cessation/primary prevention of tobacco, alcohol, or other drug use; driving or other dangerous activities under the influence; availability of treatment for abuse.   []$   Injury prevention: Discussed safety belts, safety helmets, smoke detector, smoking near bedding or upholstery.   []$    Sexuality: Discussed sexually transmitted diseases, partner selection, use of condoms, avoidance of unintended pregnancy  and contraceptive alternatives.   [x]$   Dental health: Discussed importance of regular tooth brushing, flossing, and dental visits.  [x]$   Health maintenance and immunizations reviewed. Please refer to Health maintenance section.      Orders: -     CBC with Differential/Platelet; Future -     Comprehensive metabolic panel; Future -     Lipid panel; Future -     TSH; Future -     VITAMIN D 25 Hydroxy (Vit-D Deficiency, Fractures); Future -     Vitamin B12; Future -     Hemoglobin A1c; Future  History of gestational diabetes -     Comprehensive metabolic panel; Future -     Hemoglobin A1c; Future  Changing pigmented skin lesion Assessment & Plan: Patient previously saw plastic surgery, Dr. Marla Roe.  She had planned for excision, but never scheduled a follow-up.  Provided information to have this scheduled.         Return in about 1 year (around 07/28/2023) for CPE, fasting labs .  This note was prepared with assistance of Systems analyst. Occasional wrong-word or sound-a-like substitutions may have occurred due to the inherent limitations of voice recognition software.    Liyat Faulkenberry M Audryana Hockenberry, PA-C

## 2022-07-28 ENCOUNTER — Other Ambulatory Visit (INDEPENDENT_AMBULATORY_CARE_PROVIDER_SITE_OTHER): Payer: BC Managed Care – PPO

## 2022-07-28 DIAGNOSIS — Z0000 Encounter for general adult medical examination without abnormal findings: Secondary | ICD-10-CM | POA: Diagnosis not present

## 2022-07-28 DIAGNOSIS — Z8632 Personal history of gestational diabetes: Secondary | ICD-10-CM | POA: Diagnosis not present

## 2022-07-28 LAB — CBC WITH DIFFERENTIAL/PLATELET
Basophils Absolute: 0.1 10*3/uL (ref 0.0–0.1)
Basophils Relative: 0.8 % (ref 0.0–3.0)
Eosinophils Absolute: 0.2 10*3/uL (ref 0.0–0.7)
Eosinophils Relative: 2.3 % (ref 0.0–5.0)
HCT: 39.3 % (ref 36.0–46.0)
Hemoglobin: 13 g/dL (ref 12.0–15.0)
Lymphocytes Relative: 30.3 % (ref 12.0–46.0)
Lymphs Abs: 2.2 10*3/uL (ref 0.7–4.0)
MCHC: 33.1 g/dL (ref 30.0–36.0)
MCV: 87.6 fl (ref 78.0–100.0)
Monocytes Absolute: 0.6 10*3/uL (ref 0.1–1.0)
Monocytes Relative: 8.1 % (ref 3.0–12.0)
Neutro Abs: 4.2 10*3/uL (ref 1.4–7.7)
Neutrophils Relative %: 58.5 % (ref 43.0–77.0)
Platelets: 286 10*3/uL (ref 150.0–400.0)
RBC: 4.48 Mil/uL (ref 3.87–5.11)
RDW: 13.2 % (ref 11.5–15.5)
WBC: 7.2 10*3/uL (ref 4.0–10.5)

## 2022-07-28 LAB — COMPREHENSIVE METABOLIC PANEL
ALT: 12 U/L (ref 0–35)
AST: 14 U/L (ref 0–37)
Albumin: 4.7 g/dL (ref 3.5–5.2)
Alkaline Phosphatase: 88 U/L (ref 39–117)
BUN: 14 mg/dL (ref 6–23)
CO2: 24 mEq/L (ref 19–32)
Calcium: 9.4 mg/dL (ref 8.4–10.5)
Chloride: 108 mEq/L (ref 96–112)
Creatinine, Ser: 0.73 mg/dL (ref 0.40–1.20)
GFR: 105.35 mL/min (ref >60.00)
Glucose, Bld: 97 mg/dL (ref 70–99)
Potassium: 4.2 mEq/L (ref 3.5–5.1)
Sodium: 141 mEq/L (ref 135–145)
Total Bilirubin: 0.3 mg/dL (ref 0.2–1.2)
Total Protein: 7.4 g/dL (ref 6.0–8.3)

## 2022-07-28 LAB — LIPID PANEL
Cholesterol: 222 mg/dL — ABNORMAL HIGH (ref 0–200)
HDL: 51.2 mg/dL (ref >39.00)
LDL Cholesterol: 155 mg/dL — ABNORMAL HIGH (ref 0–99)
NonHDL: 171.06
Total CHOL/HDL Ratio: 4
Triglycerides: 81 mg/dL (ref 0.0–149.0)
VLDL: 16.2 mg/dL (ref 0.0–40.0)

## 2022-07-28 LAB — TSH: TSH: 3.76 u[IU]/mL (ref 0.35–5.50)

## 2022-07-28 LAB — VITAMIN D 25 HYDROXY (VIT D DEFICIENCY, FRACTURES): VITD: 34.65 ng/mL (ref 30.00–100.00)

## 2022-07-28 LAB — HEMOGLOBIN A1C: Hgb A1c MFr Bld: 5.8 % (ref 4.6–6.5)

## 2022-07-28 LAB — VITAMIN B12: Vitamin B-12: 453 pg/mL (ref 211–911)

## 2022-07-28 NOTE — Progress Notes (Signed)
Labs look okay except: 1.A1C(3 month average of sugars) is elevated.  This is considered PreDiabetes.  Work on diet-decrease sugars and starches and aim for 30 minutes of exercise 5 days/week to prevent progression to diabetes  2.  Your cholesterol levels are elevated.  Work on low cholesterol and lower carbs/sugars diet and  get exercise to try to lower your cholesterol.

## 2022-09-09 ENCOUNTER — Encounter: Payer: Self-pay | Admitting: Physician Assistant

## 2022-09-09 ENCOUNTER — Ambulatory Visit (INDEPENDENT_AMBULATORY_CARE_PROVIDER_SITE_OTHER): Payer: BC Managed Care – PPO | Admitting: Physician Assistant

## 2022-09-09 VITALS — BP 120/74 | HR 80 | Temp 97.3°F | Ht 63.75 in | Wt 138.0 lb

## 2022-09-09 DIAGNOSIS — R5383 Other fatigue: Secondary | ICD-10-CM

## 2022-09-09 DIAGNOSIS — R319 Hematuria, unspecified: Secondary | ICD-10-CM

## 2022-09-09 DIAGNOSIS — R509 Fever, unspecified: Secondary | ICD-10-CM

## 2022-09-09 LAB — POC URINALSYSI DIPSTICK (AUTOMATED)
Bilirubin, UA: NEGATIVE
Blood, UA: POSITIVE
Glucose, UA: NEGATIVE
Ketones, UA: NEGATIVE
Leukocytes, UA: NEGATIVE
Nitrite, UA: NEGATIVE
Protein, UA: NEGATIVE
Spec Grav, UA: 1.01 (ref 1.010–1.025)
Urobilinogen, UA: 0.2 E.U./dL
pH, UA: 6 (ref 5.0–8.0)

## 2022-09-09 LAB — POCT URINE PREGNANCY: Preg Test, Ur: NEGATIVE

## 2022-09-09 LAB — POC INFLUENZA A&B (BINAX/QUICKVUE)
Influenza A, POC: NEGATIVE
Influenza B, POC: NEGATIVE

## 2022-09-09 LAB — POC COVID19 BINAXNOW: SARS Coronavirus 2 Ag: NEGATIVE

## 2022-09-09 MED ORDER — CEPHALEXIN 500 MG PO CAPS: 500.0000 mg | ORAL_CAPSULE | Freq: Two times a day (BID) | ORAL | 0 refills | Status: AC

## 2022-09-09 NOTE — Patient Instructions (Signed)
Push fluids and rest  Follow-up with Alyssa is symptoms do not improve or any further concerns  Start cephalexin for possible bladder infection

## 2022-09-09 NOTE — Progress Notes (Signed)
Rachel Molina is a 37 y.o. female here for a new problem.  History of Present Illness:   Chief Complaint  Patient presents with   Cough    Pt c/o cough, runny nose, body aches, fever 101.4 to 100.3 in the evening, takes Tylenol and then good for 24 hrs then comes back, also having fatigue. Symptoms started on Tuesday.     Cough    Fever Symptoms started 2 days ago Sx: cough, body aches, fatigue, fever up to 101.4, runny nose, fatigue Symptoms typically start around 4p, she takes tylenol and they improve Taking tylenol Denies: chest tightness, wheezing, poor appetite, sick contacts  Was recently treated for strep about a month ago -- took 10 days of amoxicillin that she had home and sx resolved  Went to [place] and [place] about two weeks ago -- developed UTI sx which is common for her when she travels and she was given Levofloxacin for 5 days and had improvement of sx   Patient's last menstrual period was 08/19/2022 (approximate).   Past Medical History:  Diagnosis Date   Asthma    GERD (gastroesophageal reflux disease)    Spontaneous pneumothorax      Social History   Tobacco Use   Smoking status: Never   Smokeless tobacco: Never  Substance Use Topics   Alcohol use: Not Currently   Drug use: Not Currently    History reviewed. No pertinent surgical history.  History reviewed. No pertinent family history.  No Known Allergies  Current Medications:   Current Outpatient Medications:    albuterol (VENTOLIN HFA) 108 (90 Base) MCG/ACT inhaler, Inhale 2 puffs into the lungs every 6 (six) hours as needed for wheezing or shortness of breath., Disp: 8 g, Rfl: 0   ibuprofen (ADVIL) 200 MG tablet, Take 600 mg by mouth every 6 (six) hours as needed for moderate pain., Disp: , Rfl:    VITAMIN D PO, Take 1,000 Units by mouth daily., Disp: , Rfl:    Review of Systems:   Review of Systems  Respiratory:  Positive for cough.     Vitals:   Vitals:   09/09/22 1019   BP: 120/74  Pulse: 80  Temp: (!) 97.3 F (36.3 C)  TempSrc: Temporal  SpO2: 97%  Weight: 138 lb (62.6 kg)  Height: 5' 3.75" (1.619 m)     Body mass index is 23.87 kg/m.  Physical Exam:   Physical Exam Vitals and nursing note reviewed.  Constitutional:      General: She is not in acute distress.    Appearance: She is well-developed. She is not ill-appearing or toxic-appearing.  HENT:     Head: Normocephalic and atraumatic.     Right Ear: Tympanic membrane, ear canal and external ear normal. Tympanic membrane is not erythematous, retracted or bulging.     Left Ear: Tympanic membrane, ear canal and external ear normal. Tympanic membrane is not erythematous, retracted or bulging.     Nose: Nose normal.     Right Sinus: No maxillary sinus tenderness or frontal sinus tenderness.     Left Sinus: No maxillary sinus tenderness or frontal sinus tenderness.     Mouth/Throat:     Pharynx: Uvula midline. No posterior oropharyngeal erythema.  Eyes:     General: Lids are normal.     Conjunctiva/sclera: Conjunctivae normal.  Neck:     Trachea: Trachea normal.  Cardiovascular:     Rate and Rhythm: Normal rate and regular rhythm.     Pulses:  Normal pulses.     Heart sounds: Normal heart sounds, S1 normal and S2 normal.  Pulmonary:     Effort: Pulmonary effort is normal.     Breath sounds: Normal breath sounds. No decreased breath sounds, wheezing, rhonchi or rales.  Abdominal:     Tenderness: There is no right CVA tenderness or left CVA tenderness.  Lymphadenopathy:     Cervical: No cervical adenopathy.  Skin:    General: Skin is warm and dry.  Neurological:     Mental Status: She is alert.     GCS: GCS eye subscore is 4. GCS verbal subscore is 5. GCS motor subscore is 6.  Psychiatric:        Speech: Speech normal.        Behavior: Behavior normal. Behavior is cooperative.     Results for orders placed or performed in visit on 09/09/22  POC COVID-19  Result Value Ref Range    SARS Coronavirus 2 Ag Negative Negative  POC Influenza A&B(BINAX/QUICKVUE)  Result Value Ref Range   Influenza A, POC Negative Negative   Influenza B, POC Negative Negative  POCT Urinalysis Dipstick (Automated)  Result Value Ref Range   Color, UA straw    Clarity, UA clear    Glucose, UA Negative Negative   Bilirubin, UA negative    Ketones, UA negtative    Spec Grav, UA 1.010 1.010 - 1.025   Blood, UA positive    pH, UA 6.0 5.0 - 8.0   Protein, UA Negative Negative   Urobilinogen, UA 0.2 0.2 or 1.0 E.U./dL   Nitrite, UA negative    Leukocytes, UA Negative Negative     Assessment and Plan:   Fever, unspecified fever cause No red flags Flu and COVID tests are negative Suspect viral URI however given recent UTI symptoms, will also check urine culture  UA has blood - will empirically treat with keflex while awaiting culture Push fluids and rest  Hx of pneumothorax but lungs are clear on my exam and vitals stable - do not suspect this  Follow-up with PCP for any lingering symptoms  If any severely rapidly worsening sx, recommend ER  Inda Coke, PA-C

## 2022-09-10 LAB — URINE CULTURE
MICRO NUMBER:: 14783135
Result:: NO GROWTH
SPECIMEN QUALITY:: ADEQUATE

## 2023-08-26 ENCOUNTER — Other Ambulatory Visit (HOSPITAL_COMMUNITY)
Admission: RE | Admit: 2023-08-26 | Discharge: 2023-08-26 | Disposition: A | Discharge status: Home / Self Care | Source: Ambulatory Visit | Attending: Physician Assistant | Admitting: Physician Assistant

## 2023-08-26 ENCOUNTER — Ambulatory Visit: Payer: BC Managed Care – PPO | Admitting: Physician Assistant

## 2023-08-26 ENCOUNTER — Encounter: Payer: Self-pay | Admitting: Physician Assistant

## 2023-08-26 VITALS — BP 110/68 | HR 72 | Temp 97.5°F | Ht 63.75 in | Wt 136.0 lb

## 2023-08-26 DIAGNOSIS — Z01419 Encounter for gynecological examination (general) (routine) without abnormal findings: Secondary | ICD-10-CM | POA: Diagnosis not present

## 2023-08-26 DIAGNOSIS — Z124 Encounter for screening for malignant neoplasm of cervix: Secondary | ICD-10-CM | POA: Diagnosis not present

## 2023-08-26 DIAGNOSIS — Z0000 Encounter for general adult medical examination without abnormal findings: Secondary | ICD-10-CM | POA: Insufficient documentation

## 2023-08-26 DIAGNOSIS — N6312 Unspecified lump in the right breast, upper inner quadrant: Secondary | ICD-10-CM

## 2023-08-26 DIAGNOSIS — Z1151 Encounter for screening for human papillomavirus (HPV): Secondary | ICD-10-CM | POA: Diagnosis not present

## 2023-08-26 DIAGNOSIS — R7303 Prediabetes: Secondary | ICD-10-CM

## 2023-08-26 DIAGNOSIS — Z113 Encounter for screening for infections with a predominantly sexual mode of transmission: Secondary | ICD-10-CM | POA: Insufficient documentation

## 2023-08-26 DIAGNOSIS — R9230 Dense breasts, unspecified: Secondary | ICD-10-CM | POA: Diagnosis not present

## 2023-08-26 LAB — LIPID PANEL
Cholesterol: 212 mg/dL — ABNORMAL HIGH (ref 0–200)
HDL: 49.1 mg/dL (ref >39.00)
LDL Cholesterol: 143 mg/dL — ABNORMAL HIGH (ref 0–99)
NonHDL: 162.42
Total CHOL/HDL Ratio: 4
Triglycerides: 96 mg/dL (ref 0.0–149.0)
VLDL: 19.2 mg/dL (ref 0.0–40.0)

## 2023-08-26 LAB — COMPREHENSIVE METABOLIC PANEL
ALT: 18 U/L (ref 0–35)
AST: 20 U/L (ref 0–37)
Albumin: 4.7 g/dL (ref 3.5–5.2)
Alkaline Phosphatase: 70 U/L (ref 39–117)
BUN: 11 mg/dL (ref 6–23)
CO2: 28 meq/L (ref 19–32)
Calcium: 9.3 mg/dL (ref 8.4–10.5)
Chloride: 103 meq/L (ref 96–112)
Creatinine, Ser: 0.7 mg/dL (ref 0.40–1.20)
GFR: 109.96 mL/min (ref >60.00)
Glucose, Bld: 94 mg/dL (ref 70–99)
Potassium: 4 meq/L (ref 3.5–5.1)
Sodium: 138 meq/L (ref 135–145)
Total Bilirubin: 0.6 mg/dL (ref 0.2–1.2)
Total Protein: 7.6 g/dL (ref 6.0–8.3)

## 2023-08-26 LAB — CBC WITH DIFFERENTIAL/PLATELET
Basophils Absolute: 0 10*3/uL (ref 0.0–0.1)
Basophils Relative: 0.5 % (ref 0.0–3.0)
Eosinophils Absolute: 0.1 10*3/uL (ref 0.0–0.7)
Eosinophils Relative: 1.9 % (ref 0.0–5.0)
HCT: 38.3 % (ref 36.0–46.0)
Hemoglobin: 12.8 g/dL (ref 12.0–15.0)
Lymphocytes Relative: 31.8 % (ref 12.0–46.0)
Lymphs Abs: 2.2 10*3/uL (ref 0.7–4.0)
MCHC: 33.3 g/dL (ref 30.0–36.0)
MCV: 89.4 fl (ref 78.0–100.0)
Monocytes Absolute: 0.6 10*3/uL (ref 0.1–1.0)
Monocytes Relative: 8.1 % (ref 3.0–12.0)
Neutro Abs: 4 10*3/uL (ref 1.4–7.7)
Neutrophils Relative %: 57.7 % (ref 43.0–77.0)
Platelets: 283 10*3/uL (ref 150.0–400.0)
RBC: 4.28 Mil/uL (ref 3.87–5.11)
RDW: 13.2 % (ref 11.5–15.5)
WBC: 6.9 10*3/uL (ref 4.0–10.5)

## 2023-08-26 LAB — TSH: TSH: 5.06 u[IU]/mL (ref 0.35–5.50)

## 2023-08-26 LAB — HEMOGLOBIN A1C: Hgb A1c MFr Bld: 5.6 % (ref 4.6–6.5)

## 2023-08-26 LAB — VITAMIN B12: Vitamin B-12: 699 pg/mL (ref 211–911)

## 2023-08-26 LAB — VITAMIN D 25 HYDROXY (VIT D DEFICIENCY, FRACTURES): VITD: 36.52 ng/mL (ref 30.00–100.00)

## 2023-08-26 NOTE — Progress Notes (Signed)
 Patient ID: Rachel Molina, female    DOB: [birth year], 38 y.o.   MRN: [id number]   Assessment & Plan:  Annual physical exam -     CBC with Differential/Platelet -     Comprehensive metabolic panel -     Lipid panel -     TSH -     Cytology - PAP -     VITAMIN D 25 Hydroxy (Vit-D Deficiency, Fractures) -     Vitamin B12  Well woman exam with routine gynecological exam -     CBC with Differential/Platelet -     Comprehensive metabolic panel -     Lipid panel -     TSH -     Cytology - PAP -     VITAMIN D 25 Hydroxy (Vit-D Deficiency, Fractures) -     Vitamin B12  Prediabetes -     Hemoglobin A1c  Mass of upper inner quadrant of right breast -     MM 3D DIAGNOSTIC MAMMOGRAM BILATERAL BREAST; Future  Dense breast tissue on mammogram, unspecified type -     MM 3D DIAGNOSTIC MAMMOGRAM BILATERAL BREAST; Future      Assessment and Plan Assessment & Plan Dense Breast Tissue with Palpable Mass Rachel Molina has dense breast tissue, classified as category D, with a palpable mass in the upper outer quadrant of the right breast. Previous diagnostic mammogram and ultrasound in January 2023 were reassuring, with follow-up recommended at age 40. However, the mass appears potentially larger than previously described. No family history of breast cancer. - Order updated diagnostic mammogram at the breast center.  Annual Wellness Exam Rachel Molina, a 38 year old female, presented for her annual wellness exam. Her health is well-managed with no significant changes over the past year. She maintains a healthy lifestyle through exercise and nutrition. She requested vitamin D and B12 level checks as part of her regular blood work. - Perform Pap smear. - Order basic labs including vitamin D and B12 levels.  Mole for Removal Rachel Molina has a mole present since childhood, which increased in size after pregnancy. She has a scheduled appointment for its removal in May. The mole appears easily removable,  with no anticipated complications. - Proceed with scheduled mole removal in May.  General Health Maintenance Rachel Molina's general health maintenance includes ensuring dental and vision care are up to date. She has undergone Lasik surgery with no current issues of dry eye. - Ensure dental and vision care are up to date.  Follow-up Follow-up plans include reviewing lab and Pap smear results. - Review lab results and communicate findings via MyChart. - Review Pap smear results, expected in about a week, and communicate findings. - Schedule next annual physical in one year.      Return in about 1 year (around 08/25/2024) for physical.    Subjective:    Chief Complaint  Patient presents with   Annual Exam    Pt is present for Annual Exam pt states she is fasting for labs.    HPI Discussed the use of AI scribe software for clinical note transcription with the patient, who gave verbal consent to proceed.  History of Present Illness Rachel Molina is a 38 year old female who presents for her annual wellness exam.  She reports stable health over the past year with no significant illnesses or hospitalizations, except for an episode of the flu. She is attempting to maintain a healthy lifestyle through exercise and nutrition, although inconsistently. No issues with her menstrual  cycle are noted.  She has dense breast tissue, classified as category D, and underwent a diagnostic mammogram and ultrasound in January 2023, which were reassuring. She has a palpable mass in the upper outer quadrant of her right breast, noted during the previous workup. There is no family history of breast cancer, and no changes in her breast tissue have been observed since the last evaluation.  She has a mole that has been present since childhood, which increased in size after her pregnancy. She has scheduled a removal for May. No new moles or rashes are reported.  She underwent LASIK surgery and initially  experienced dry eyes, which have since resolved. Her vision and dental health are up to date.  She is married and has two children, aged 13 and 8, both delivered vaginally without complications.     Past Medical History:  Diagnosis Date   Asthma    GERD (gastroesophageal reflux disease)    Spontaneous pneumothorax     Past Surgical History:  Procedure Laterality Date   LASIK Bilateral 2023    No family history on file.  Social History   Tobacco Use   Smoking status: Never   Smokeless tobacco: Never  Substance Use Topics   Alcohol use: Not Currently   Drug use: Not Currently     No Known Allergies  Review of Systems NEGATIVE UNLESS OTHERWISE INDICATED IN HPI      Objective:     BP 110/68   Pulse 72   Temp (!) 97.5 F (36.4 C) (Temporal)   Ht 5' 3.75" (1.619 m)   Wt 136 lb (61.7 kg)   LMP 08/05/2023 (Approximate)   SpO2 98%   BMI 23.53 kg/m   Wt Readings from Last 3 Encounters:  08/26/23 136 lb (61.7 kg)  09/09/22 138 lb (62.6 kg)  07/27/22 137 lb 9.6 oz (62.4 kg)    BP Readings from Last 3 Encounters:  08/26/23 110/68  09/09/22 120/74  07/27/22 106/74     Physical Exam Vitals and nursing note reviewed. Exam conducted with a chaperone present.  Constitutional:      Appearance: Normal appearance. She is normal weight. She is not toxic-appearing.  HENT:     Head: Normocephalic and atraumatic.     Right Ear: Tympanic membrane, ear canal and external ear normal.     Left Ear: Tympanic membrane, ear canal and external ear normal.     Nose: Nose normal.     Mouth/Throat:     Mouth: Mucous membranes are moist.  Eyes:     Extraocular Movements: Extraocular movements intact.     Conjunctiva/sclera: Conjunctivae normal.     Pupils: Pupils are equal, round, and reactive to light.  Neck:     Thyroid: No thyroid mass, thyromegaly or thyroid tenderness.  Cardiovascular:     Rate and Rhythm: Normal rate and regular rhythm.     Pulses: Normal pulses.      Heart sounds: Normal heart sounds.  Pulmonary:     Effort: Pulmonary effort is normal.     Breath sounds: Normal breath sounds.  Chest:     Chest wall: No mass.  Breasts:    Right: Mass (upper middle breast mass noted) present. No swelling, bleeding, inverted nipple, nipple discharge, skin change or tenderness.     Left: Normal. No swelling, bleeding, inverted nipple, mass, nipple discharge, skin change or tenderness.     Comments: Very dense breast tissue bilaterally  Abdominal:     General: Abdomen is flat.  Bowel sounds are normal.     Palpations: Abdomen is soft.  Genitourinary:    General: Normal vulva.     Labia:        Right: No rash, tenderness or lesion.        Left: No rash, tenderness or lesion.      Vagina: Normal.     Cervix: Normal.     Uterus: Normal.      Adnexa: Right adnexa normal and left adnexa normal.  Musculoskeletal:        General: Normal range of motion.     Cervical back: Normal range of motion and neck supple.     Right lower leg: No edema.     Left lower leg: No edema.  Lymphadenopathy:     Cervical: No cervical adenopathy.     Upper Body:     Right upper body: No supraclavicular, axillary or pectoral adenopathy.     Left upper body: No supraclavicular, axillary or pectoral adenopathy.  Skin:    General: Skin is warm and dry.     Findings: Lesion (left jawline raised nevus) present.  Neurological:     General: No focal deficit present.     Mental Status: She is alert and oriented to person, place, and time.  Psychiatric:        Mood and Affect: Mood normal.        Behavior: Behavior normal.        Thought Content: Thought content normal.        Judgment: Judgment normal.             Levi Crass M Maliea Grandmaison, PA-C

## 2023-08-29 ENCOUNTER — Other Ambulatory Visit: Payer: Self-pay | Admitting: Physician Assistant

## 2023-08-29 DIAGNOSIS — N6312 Unspecified lump in the right breast, upper inner quadrant: Secondary | ICD-10-CM

## 2023-08-29 DIAGNOSIS — R9230 Dense breasts, unspecified: Secondary | ICD-10-CM

## 2023-08-29 LAB — CYTOLOGY - PAP
Adequacy: ABSENT
Chlamydia: NEGATIVE
Comment: NEGATIVE
Comment: NEGATIVE
Comment: NEGATIVE
Comment: NORMAL
Diagnosis: NEGATIVE
High risk HPV: NEGATIVE
Neisseria Gonorrhea: NEGATIVE
Trichomonas: NEGATIVE

## 2023-08-30 ENCOUNTER — Encounter: Payer: Self-pay | Admitting: Physician Assistant

## 2023-09-08 ENCOUNTER — Ambulatory Visit
Admission: RE | Admit: 2023-09-08 | Discharge: 2023-09-08 | Disposition: A | Discharge status: Home / Self Care | Source: Ambulatory Visit | Attending: Physician Assistant

## 2023-09-08 DIAGNOSIS — N6311 Unspecified lump in the right breast, upper outer quadrant: Secondary | ICD-10-CM | POA: Diagnosis not present

## 2023-09-08 DIAGNOSIS — N6312 Unspecified lump in the right breast, upper inner quadrant: Secondary | ICD-10-CM

## 2023-09-08 DIAGNOSIS — R9230 Dense breasts, unspecified: Secondary | ICD-10-CM

## 2023-09-08 DIAGNOSIS — N6011 Diffuse cystic mastopathy of right breast: Secondary | ICD-10-CM | POA: Diagnosis not present

## 2023-09-09 ENCOUNTER — Encounter: Payer: Self-pay | Admitting: Physician Assistant

## 2023-10-11 ENCOUNTER — Ambulatory Visit (INDEPENDENT_AMBULATORY_CARE_PROVIDER_SITE_OTHER): Payer: BC Managed Care – PPO | Admitting: Plastic Surgery

## 2023-10-11 VITALS — BP 106/70 | HR 68

## 2023-10-11 DIAGNOSIS — L819 Disorder of pigmentation, unspecified: Secondary | ICD-10-CM | POA: Diagnosis not present

## 2023-10-11 NOTE — Progress Notes (Signed)
   Subjective:    Patient ID: Rachel Molina, female    DOB: [birth year], 38 y.o.   MRN: [id number]  The patient is a 38 year old female here for evaluation of a changing skin lesion on her left cheek.  She was seen several years ago and due to life circumstances was unable to have it removed.  Since then it is actually getting a little bigger and a little darker.  It is approximately a centimeter in size on the left lower cheek area.  She is otherwise in good health and does not have any other concerning skin lesions.      Review of Systems  Constitutional: Negative.   Eyes: Negative.   Respiratory: Negative.    Cardiovascular: Negative.   Gastrointestinal: Negative.   Genitourinary: Negative.   Musculoskeletal: Negative.        Objective:   Physical Exam HENT:     Head: Atraumatic.  Cardiovascular:     Rate and Rhythm: Normal rate.     Pulses: Normal pulses.  Pulmonary:     Effort: Pulmonary effort is normal.  Musculoskeletal:        General: No swelling or deformity.  Skin:    General: Skin is warm.     Capillary Refill: Capillary refill takes less than 2 seconds.     Coloration: Skin is not jaundiced.     Findings: Lesion present. No bruising.  Neurological:     Mental Status: She is alert and oriented to person, place, and time.  Psychiatric:        Mood and Affect: Mood normal.        Behavior: Behavior normal.        Thought Content: Thought content normal.        Assessment & Plan:     ICD-10-CM   1. Changing pigmented skin lesion  L81.9        In for excision of changing skin lesion left cheek here in the office.  She will have a scar and we did discuss that.  Pictures were obtained of the patient and placed in the chart with the patient's or guardian's permission.

## 2024-01-17 ENCOUNTER — Ambulatory Visit: Admitting: Plastic Surgery

## 2024-01-17 ENCOUNTER — Encounter: Payer: Self-pay | Admitting: Plastic Surgery

## 2024-01-17 ENCOUNTER — Other Ambulatory Visit (HOSPITAL_COMMUNITY)
Admission: RE | Admit: 2024-01-17 | Discharge: 2024-01-17 | Disposition: A | Discharge status: Home / Self Care | Source: Ambulatory Visit | Attending: Plastic Surgery | Admitting: Plastic Surgery

## 2024-01-17 VITALS — BP 113/77 | HR 85 | Ht 64.0 in | Wt 138.6 lb

## 2024-01-17 DIAGNOSIS — D2239 Melanocytic nevi of other parts of face: Secondary | ICD-10-CM | POA: Diagnosis not present

## 2024-01-17 DIAGNOSIS — L819 Disorder of pigmentation, unspecified: Secondary | ICD-10-CM | POA: Insufficient documentation

## 2024-01-17 DIAGNOSIS — D2339 Other benign neoplasm of skin of other parts of face: Secondary | ICD-10-CM | POA: Diagnosis not present

## 2024-01-17 NOTE — Progress Notes (Signed)
 Procedure Note  Preoperative Dx: changing pigmented skin lesion of left jaw line  Postoperative Dx: Same  Procedure: excision of pigmented skin lesion of left jaw line 1.5 cm  Anesthesia: Lidocaine 1% with 1:100,000 epinephrine  Indication for Procedure: skin lesion  Description of Procedure: Risks and complications were explained to the patient.  Consent was confirmed and the patient understands the risks and benefits.  The potential complications and alternatives were explained and the patient consents.  The patient expressed understanding the option of not having the procedure and the risks of a scar.  Time out was called and all information was confirmed to be correct.    The area was prepped and drapped.  Lidocaine 1% with epinephrine was injected in the subcutaneous area.  After waiting several minutes for the local to take affect a #15 blade was used to excise the area in an eliptical pattern.   The skin edges were reapproximated with 5-0 Monocryl.  A dressing was applied.  The patient was given instructions on how to care for the area and a follow up appointment.  Rachel Molina tolerated the procedure well and there were no complications. The specimen was sent to pathology.

## 2024-01-17 NOTE — Addendum Note (Signed)
 Addended by: LOWERY ESTEFANA RAMAN on: 01/17/2024 10:56 AM   Modules accepted: Orders

## 2024-01-19 ENCOUNTER — Telehealth: Payer: Self-pay | Admitting: Plastic Surgery

## 2024-01-19 LAB — SURGICAL PATHOLOGY

## 2024-01-19 NOTE — Telephone Encounter (Signed)
 Patient is wondering if she can change her dressing, next appt is a suture removal on 8-22

## 2024-01-19 NOTE — Telephone Encounter (Signed)
 Yes, that's fine. Thank you!

## 2024-01-26 NOTE — Progress Notes (Unsigned)
 Patient is a 38 year old female s/p excision of pigmented skin lesion left jawline performed 01/17/2024 by Dr. Lowery who returns to clinic for postprocedural follow-up.  Reviewed procedural report and the excision site was closed with 5-0 Monocryl.  Specimen was consistent with benign dermal nevus, no atypia or pregnancy noted.  Deep margin involved, peripheral margins free.

## 2024-01-27 ENCOUNTER — Ambulatory Visit (INDEPENDENT_AMBULATORY_CARE_PROVIDER_SITE_OTHER): Admitting: Physician Assistant

## 2024-01-27 VITALS — BP 114/75 | HR 69

## 2024-01-27 DIAGNOSIS — Z719 Counseling, unspecified: Secondary | ICD-10-CM

## 2024-01-27 DIAGNOSIS — D239 Other benign neoplasm of skin, unspecified: Secondary | ICD-10-CM

## 2024-08-30 ENCOUNTER — Encounter: Admitting: Physician Assistant
